# Patient Record
Sex: Male | Born: 1935 | Race: White | Hispanic: No | Marital: Married | Smoking: Former smoker
Health system: Southern US, Community
[De-identification: ages and names within clinical notes are randomized; demographics above are authoritative.]

## PROBLEM LIST (undated history)

## (undated) DIAGNOSIS — M1990 Unspecified osteoarthritis, unspecified site: Secondary | ICD-10-CM

## (undated) DIAGNOSIS — E119 Type 2 diabetes mellitus without complications: Secondary | ICD-10-CM

## (undated) DIAGNOSIS — M792 Neuralgia and neuritis, unspecified: Secondary | ICD-10-CM

## (undated) DIAGNOSIS — K219 Gastro-esophageal reflux disease without esophagitis: Secondary | ICD-10-CM

## (undated) DIAGNOSIS — I10 Essential (primary) hypertension: Secondary | ICD-10-CM

## (undated) HISTORY — PX: BACK SURGERY: SHX140

## (undated) HISTORY — PX: JOINT REPLACEMENT: SHX530

---

## 2011-08-30 DIAGNOSIS — J310 Chronic rhinitis: Secondary | ICD-10-CM | POA: Insufficient documentation

## 2011-08-30 DIAGNOSIS — J342 Deviated nasal septum: Secondary | ICD-10-CM | POA: Insufficient documentation

## 2014-01-16 DIAGNOSIS — M25511 Pain in right shoulder: Secondary | ICD-10-CM | POA: Insufficient documentation

## 2014-01-16 DIAGNOSIS — C4491 Basal cell carcinoma of skin, unspecified: Secondary | ICD-10-CM | POA: Insufficient documentation

## 2014-01-16 DIAGNOSIS — M205X9 Other deformities of toe(s) (acquired), unspecified foot: Secondary | ICD-10-CM | POA: Insufficient documentation

## 2014-01-16 DIAGNOSIS — N529 Male erectile dysfunction, unspecified: Secondary | ICD-10-CM | POA: Insufficient documentation

## 2014-01-16 DIAGNOSIS — K219 Gastro-esophageal reflux disease without esophagitis: Secondary | ICD-10-CM | POA: Insufficient documentation

## 2014-01-16 DIAGNOSIS — R531 Weakness: Secondary | ICD-10-CM | POA: Insufficient documentation

## 2014-01-16 DIAGNOSIS — M109 Gout, unspecified: Secondary | ICD-10-CM | POA: Insufficient documentation

## 2014-01-16 DIAGNOSIS — N181 Chronic kidney disease, stage 1: Secondary | ICD-10-CM | POA: Insufficient documentation

## 2014-01-16 DIAGNOSIS — E1129 Type 2 diabetes mellitus with other diabetic kidney complication: Secondary | ICD-10-CM | POA: Diagnosis present

## 2014-01-16 DIAGNOSIS — M1990 Unspecified osteoarthritis, unspecified site: Secondary | ICD-10-CM | POA: Insufficient documentation

## 2014-01-16 DIAGNOSIS — N400 Enlarged prostate without lower urinary tract symptoms: Secondary | ICD-10-CM | POA: Insufficient documentation

## 2014-01-16 DIAGNOSIS — E1142 Type 2 diabetes mellitus with diabetic polyneuropathy: Secondary | ICD-10-CM | POA: Diagnosis present

## 2014-01-16 DIAGNOSIS — M7541 Impingement syndrome of right shoulder: Secondary | ICD-10-CM | POA: Insufficient documentation

## 2014-01-16 DIAGNOSIS — H409 Unspecified glaucoma: Secondary | ICD-10-CM | POA: Insufficient documentation

## 2014-01-16 DIAGNOSIS — N1832 Chronic kidney disease, stage 3b: Secondary | ICD-10-CM | POA: Diagnosis present

## 2014-01-16 DIAGNOSIS — M7551 Bursitis of right shoulder: Secondary | ICD-10-CM | POA: Insufficient documentation

## 2015-04-01 DIAGNOSIS — M2041 Other hammer toe(s) (acquired), right foot: Secondary | ICD-10-CM | POA: Insufficient documentation

## 2015-04-01 DIAGNOSIS — E782 Mixed hyperlipidemia: Secondary | ICD-10-CM | POA: Diagnosis present

## 2015-04-01 DIAGNOSIS — Z86010 Personal history of colonic polyps: Secondary | ICD-10-CM | POA: Insufficient documentation

## 2015-04-01 DIAGNOSIS — G629 Polyneuropathy, unspecified: Secondary | ICD-10-CM | POA: Insufficient documentation

## 2015-04-01 DIAGNOSIS — M170 Bilateral primary osteoarthritis of knee: Secondary | ICD-10-CM | POA: Insufficient documentation

## 2015-04-01 DIAGNOSIS — M2042 Other hammer toe(s) (acquired), left foot: Secondary | ICD-10-CM | POA: Insufficient documentation

## 2017-02-16 DIAGNOSIS — M4310 Spondylolisthesis, site unspecified: Secondary | ICD-10-CM | POA: Insufficient documentation

## 2017-04-12 DIAGNOSIS — M5136 Other intervertebral disc degeneration, lumbar region: Secondary | ICD-10-CM | POA: Insufficient documentation

## 2017-05-16 DIAGNOSIS — G894 Chronic pain syndrome: Secondary | ICD-10-CM | POA: Insufficient documentation

## 2017-07-04 DIAGNOSIS — M1611 Unilateral primary osteoarthritis, right hip: Secondary | ICD-10-CM | POA: Insufficient documentation

## 2017-08-01 DIAGNOSIS — Z96641 Presence of right artificial hip joint: Secondary | ICD-10-CM | POA: Insufficient documentation

## 2018-05-30 DIAGNOSIS — Z981 Arthrodesis status: Secondary | ICD-10-CM | POA: Insufficient documentation

## 2018-08-09 DIAGNOSIS — Z9889 Other specified postprocedural states: Secondary | ICD-10-CM | POA: Insufficient documentation

## 2019-01-06 DIAGNOSIS — Z9889 Other specified postprocedural states: Secondary | ICD-10-CM | POA: Insufficient documentation

## 2020-03-02 DIAGNOSIS — Z87898 Personal history of other specified conditions: Secondary | ICD-10-CM | POA: Insufficient documentation

## 2020-03-02 DIAGNOSIS — G959 Disease of spinal cord, unspecified: Secondary | ICD-10-CM | POA: Insufficient documentation

## 2020-03-31 DIAGNOSIS — M7138 Other bursal cyst, other site: Secondary | ICD-10-CM | POA: Insufficient documentation

## 2020-07-06 DIAGNOSIS — D500 Iron deficiency anemia secondary to blood loss (chronic): Secondary | ICD-10-CM | POA: Insufficient documentation

## 2020-07-06 DIAGNOSIS — R55 Syncope and collapse: Secondary | ICD-10-CM | POA: Insufficient documentation

## 2020-07-06 DIAGNOSIS — E876 Hypokalemia: Secondary | ICD-10-CM | POA: Insufficient documentation

## 2020-07-06 DIAGNOSIS — R778 Other specified abnormalities of plasma proteins: Secondary | ICD-10-CM | POA: Insufficient documentation

## 2020-07-06 DIAGNOSIS — R319 Hematuria, unspecified: Secondary | ICD-10-CM | POA: Insufficient documentation

## 2020-07-06 DIAGNOSIS — R7989 Other specified abnormal findings of blood chemistry: Secondary | ICD-10-CM | POA: Insufficient documentation

## 2020-11-12 DIAGNOSIS — L821 Other seborrheic keratosis: Secondary | ICD-10-CM | POA: Insufficient documentation

## 2021-02-02 DIAGNOSIS — R29898 Other symptoms and signs involving the musculoskeletal system: Secondary | ICD-10-CM | POA: Insufficient documentation

## 2021-05-27 ENCOUNTER — Emergency Department (HOSPITAL_BASED_OUTPATIENT_CLINIC_OR_DEPARTMENT_OTHER): Payer: Medicare Other

## 2021-05-27 ENCOUNTER — Encounter (HOSPITAL_BASED_OUTPATIENT_CLINIC_OR_DEPARTMENT_OTHER): Payer: Self-pay | Admitting: *Deleted

## 2021-05-27 ENCOUNTER — Inpatient Hospital Stay (HOSPITAL_BASED_OUTPATIENT_CLINIC_OR_DEPARTMENT_OTHER)
Admission: EM | Admit: 2021-05-27 | Discharge: 2021-05-30 | DRG: 561 | Disposition: A | Discharge status: Skilled Nursing Facility | Payer: Medicare Other | Attending: Internal Medicine | Admitting: Internal Medicine

## 2021-05-27 ENCOUNTER — Other Ambulatory Visit: Payer: Self-pay

## 2021-05-27 DIAGNOSIS — H905 Unspecified sensorineural hearing loss: Secondary | ICD-10-CM | POA: Insufficient documentation

## 2021-05-27 DIAGNOSIS — Z87891 Personal history of nicotine dependence: Secondary | ICD-10-CM

## 2021-05-27 DIAGNOSIS — Z7984 Long term (current) use of oral hypoglycemic drugs: Secondary | ICD-10-CM

## 2021-05-27 DIAGNOSIS — S0181XA Laceration without foreign body of other part of head, initial encounter: Secondary | ICD-10-CM | POA: Diagnosis present

## 2021-05-27 DIAGNOSIS — Z66 Do not resuscitate: Secondary | ICD-10-CM | POA: Diagnosis present

## 2021-05-27 DIAGNOSIS — I129 Hypertensive chronic kidney disease with stage 1 through stage 4 chronic kidney disease, or unspecified chronic kidney disease: Secondary | ICD-10-CM | POA: Diagnosis present

## 2021-05-27 DIAGNOSIS — E1142 Type 2 diabetes mellitus with diabetic polyneuropathy: Secondary | ICD-10-CM | POA: Diagnosis present

## 2021-05-27 DIAGNOSIS — H9319 Tinnitus, unspecified ear: Secondary | ICD-10-CM | POA: Insufficient documentation

## 2021-05-27 DIAGNOSIS — Z79899 Other long term (current) drug therapy: Secondary | ICD-10-CM

## 2021-05-27 DIAGNOSIS — Z96649 Presence of unspecified artificial hip joint: Secondary | ICD-10-CM

## 2021-05-27 DIAGNOSIS — I10 Essential (primary) hypertension: Secondary | ICD-10-CM | POA: Diagnosis present

## 2021-05-27 DIAGNOSIS — Z888 Allergy status to other drugs, medicaments and biological substances status: Secondary | ICD-10-CM

## 2021-05-27 DIAGNOSIS — K219 Gastro-esophageal reflux disease without esophagitis: Secondary | ICD-10-CM | POA: Diagnosis present

## 2021-05-27 DIAGNOSIS — Z0001 Encounter for general adult medical examination with abnormal findings: Secondary | ICD-10-CM | POA: Insufficient documentation

## 2021-05-27 DIAGNOSIS — Z20822 Contact with and (suspected) exposure to covid-19: Secondary | ICD-10-CM | POA: Diagnosis present

## 2021-05-27 DIAGNOSIS — E669 Obesity, unspecified: Secondary | ICD-10-CM | POA: Insufficient documentation

## 2021-05-27 DIAGNOSIS — E782 Mixed hyperlipidemia: Secondary | ICD-10-CM | POA: Diagnosis present

## 2021-05-27 DIAGNOSIS — M9701XA Periprosthetic fracture around internal prosthetic right hip joint, initial encounter: Principal | ICD-10-CM | POA: Diagnosis present

## 2021-05-27 DIAGNOSIS — R001 Bradycardia, unspecified: Secondary | ICD-10-CM | POA: Diagnosis not present

## 2021-05-27 DIAGNOSIS — M978XXA Periprosthetic fracture around other internal prosthetic joint, initial encounter: Secondary | ICD-10-CM

## 2021-05-27 DIAGNOSIS — Z7982 Long term (current) use of aspirin: Secondary | ICD-10-CM

## 2021-05-27 DIAGNOSIS — E1129 Type 2 diabetes mellitus with other diabetic kidney complication: Secondary | ICD-10-CM | POA: Diagnosis present

## 2021-05-27 DIAGNOSIS — M25551 Pain in right hip: Secondary | ICD-10-CM

## 2021-05-27 DIAGNOSIS — W01190A Fall on same level from slipping, tripping and stumbling with subsequent striking against furniture, initial encounter: Secondary | ICD-10-CM | POA: Diagnosis present

## 2021-05-27 DIAGNOSIS — M109 Gout, unspecified: Secondary | ICD-10-CM | POA: Insufficient documentation

## 2021-05-27 DIAGNOSIS — Z461 Encounter for fitting and adjustment of hearing aid: Secondary | ICD-10-CM | POA: Insufficient documentation

## 2021-05-27 DIAGNOSIS — E1122 Type 2 diabetes mellitus with diabetic chronic kidney disease: Secondary | ICD-10-CM | POA: Diagnosis present

## 2021-05-27 DIAGNOSIS — N1832 Chronic kidney disease, stage 3b: Secondary | ICD-10-CM | POA: Diagnosis present

## 2021-05-27 DIAGNOSIS — Z7409 Other reduced mobility: Secondary | ICD-10-CM | POA: Insufficient documentation

## 2021-05-27 DIAGNOSIS — R269 Unspecified abnormalities of gait and mobility: Secondary | ICD-10-CM | POA: Diagnosis present

## 2021-05-27 DIAGNOSIS — M25569 Pain in unspecified knee: Secondary | ICD-10-CM | POA: Insufficient documentation

## 2021-05-27 DIAGNOSIS — R262 Difficulty in walking, not elsewhere classified: Secondary | ICD-10-CM | POA: Diagnosis present

## 2021-05-27 DIAGNOSIS — Y92511 Restaurant or cafe as the place of occurrence of the external cause: Secondary | ICD-10-CM

## 2021-05-27 HISTORY — DX: Gastro-esophageal reflux disease without esophagitis: K21.9

## 2021-05-27 HISTORY — DX: Essential (primary) hypertension: I10

## 2021-05-27 HISTORY — DX: Type 2 diabetes mellitus without complications: E11.9

## 2021-05-27 HISTORY — DX: Unspecified osteoarthritis, unspecified site: M19.90

## 2021-05-27 HISTORY — DX: Neuralgia and neuritis, unspecified: M79.2

## 2021-05-27 LAB — RESP PANEL BY RT-PCR (FLU A&B, COVID) ARPGX2
Influenza A by PCR: NEGATIVE
Influenza B by PCR: NEGATIVE
SARS Coronavirus 2 by RT PCR: NEGATIVE

## 2021-05-27 LAB — BASIC METABOLIC PANEL
Anion gap: 12 (ref 5–15)
BUN: 36 mg/dL — ABNORMAL HIGH (ref 8–23)
CO2: 23 mmol/L (ref 22–32)
Calcium: 9.1 mg/dL (ref 8.9–10.3)
Chloride: 101 mmol/L (ref 98–111)
Creatinine, Ser: 1.82 mg/dL — ABNORMAL HIGH (ref 0.61–1.24)
GFR, Estimated: 35 mL/min — ABNORMAL LOW (ref >60)
Glucose, Bld: 134 mg/dL — ABNORMAL HIGH (ref 70–99)
Potassium: 3.9 mmol/L (ref 3.5–5.1)
Sodium: 136 mmol/L (ref 135–145)

## 2021-05-27 LAB — CBC WITH DIFFERENTIAL/PLATELET
Abs Immature Granulocytes: 0.06 10*3/uL (ref 0.00–0.07)
Basophils Absolute: 0.1 10*3/uL (ref 0.0–0.1)
Basophils Relative: 1 %
Eosinophils Absolute: 0.2 10*3/uL (ref 0.0–0.5)
Eosinophils Relative: 2 %
HCT: 32.8 % — ABNORMAL LOW (ref 39.0–52.0)
Hemoglobin: 11.5 g/dL — ABNORMAL LOW (ref 13.0–17.0)
Immature Granulocytes: 1 %
Lymphocytes Relative: 12 %
Lymphs Abs: 1.2 10*3/uL (ref 0.7–4.0)
MCH: 31.3 pg (ref 26.0–34.0)
MCHC: 35.1 g/dL (ref 30.0–36.0)
MCV: 89.1 fL (ref 80.0–100.0)
Monocytes Absolute: 0.5 10*3/uL (ref 0.1–1.0)
Monocytes Relative: 5 %
Neutro Abs: 7.8 10*3/uL — ABNORMAL HIGH (ref 1.7–7.7)
Neutrophils Relative %: 79 %
Platelets: 169 10*3/uL (ref 150–400)
RBC: 3.68 MIL/uL — ABNORMAL LOW (ref 4.22–5.81)
RDW: 13.2 % (ref 11.5–15.5)
WBC: 9.9 10*3/uL (ref 4.0–10.5)
nRBC: 0 % (ref 0.0–0.2)

## 2021-05-27 LAB — CBG MONITORING, ED
Glucose-Capillary: 157 mg/dL — ABNORMAL HIGH (ref 70–99)
Glucose-Capillary: 139 mg/dL — ABNORMAL HIGH (ref 70–99)

## 2021-05-27 MED ORDER — SODIUM CHLORIDE 0.9 % IV BOLUS
1000.0000 mL | Freq: Once | INTRAVENOUS | Status: AC
  Administered 2021-05-27: 1000 mL via INTRAVENOUS

## 2021-05-27 MED ORDER — OXYCODONE HCL 5 MG PO TABS
5.0000 mg | ORAL_TABLET | Freq: Once | ORAL | Status: AC
  Administered 2021-05-27: 5 mg via ORAL
  Filled 2021-05-27: qty 1

## 2021-05-27 MED ORDER — ACETAMINOPHEN 500 MG PO TABS
1000.0000 mg | ORAL_TABLET | Freq: Once | ORAL | Status: AC
  Administered 2021-05-27: 1000 mg via ORAL
  Filled 2021-05-27: qty 2

## 2021-05-27 MED ORDER — LIDOCAINE-EPINEPHRINE (PF) 2 %-1:200000 IJ SOLN
10.0000 mL | Freq: Once | INTRAMUSCULAR | Status: AC
  Administered 2021-05-27: 10 mL via INTRADERMAL
  Filled 2021-05-27: qty 20

## 2021-05-27 NOTE — ED Provider Notes (Signed)
[facility] EMERGENCY DEPARTMENT Provider Note   CSN: [id number] Arrival date & time: 05/27/21  1306     History  Chief Complaint  Patient presents with   Laceration    Joe Byrd is a 88 y.o. male.  88 yo M with a chief complaint of a fall.  The patient went to a [place] after swimming this morning and he lost his balance carrying his food and tripped over his cane.  He fell onto his forehead.  Also complaining of pain to the right hip.  Felt like he is not able to bear weight with that right leg anymore.  Has a history of a right hip replacement on that side.   Laceration     Home Medications Prior to Admission medications   Medication Sig Start Date End Date Taking? Authorizing Provider  aspirin 81 MG chewable tablet Chew 81 mg by mouth daily.   Yes [provider]  gabapentin (NEURONTIN) 250 MG/5ML solution Take by mouth 3 (three) times daily.   Yes [provider]  metFORMIN (GLUCOPHAGE) 500 MG tablet Take by mouth 2 (two) times daily with a meal.   Yes [provider]      Allergies    Lipitor [atorvastatin]    Review of Systems   Review of Systems  Physical Exam Updated Vital Signs BP (!) 126/54 (BP Location: Right Arm)    Pulse (!) 40    Temp 98.2 F (36.8 C) (Oral)    Resp 14    Ht 5\' 10"  (1.778 m)    Wt 87.5 kg    SpO2 95%    BMI 27.69 kg/m  Physical Exam Vitals and nursing note reviewed.  Constitutional:      Appearance: He is well-developed.  HENT:     Head: Normocephalic.     Comments: Stellate laceration to the right forehead just above the brow.  No obvious midline C-spine tenderness.  Able to rotate his head without pain. Eyes:     Pupils: Pupils are equal, round, and reactive to light.  Neck:     Vascular: No JVD.  Cardiovascular:     Rate and Rhythm: Normal rate and regular rhythm.     Heart sounds: No murmur heard.   No friction rub. No gallop.  Pulmonary:     Effort: No respiratory  distress.     Breath sounds: No wheezing.  Abdominal:     General: There is no distension.     Tenderness: There is no abdominal tenderness. There is no guarding or rebound.  Musculoskeletal:        General: Tenderness present. Normal range of motion.     Cervical back: Normal range of motion and neck supple.     Comments: Right lower extremity is slightly longer than the left.  Some mild pain with internal and external rotation of the leg.  Pain with compression of the pelvis.  No obvious pain with deep palpation in the right lower quadrant.  No pelvic instability.  Skin:    Coloration: Skin is not pale.     Findings: No rash.  Neurological:     Mental Status: He is alert and oriented to person, place, and time.  Psychiatric:        Behavior: Behavior normal.    ED Results / Procedures / Treatments   Labs (all labs ordered are listed, but only abnormal results are displayed) Labs Reviewed  CBC WITH DIFFERENTIAL/PLATELET - Abnormal; Notable for the following components:  Result Value   RBC 3.68 (*)    Hemoglobin 11.5 (*)    HCT 32.8 (*)    Neutro Abs 7.8 (*)    All other components within normal limits  BASIC METABOLIC PANEL - Abnormal; Notable for the following components:   Glucose, Bld 134 (*)    BUN 36 (*)    Creatinine, Ser 1.82 (*)    GFR, Estimated 35 (*)    All other components within normal limits    EKG None  Radiology CT Head Wo Contrast  Result Date: 05/27/2021 CLINICAL DATA:  Head trauma, minor (Age >= 65y). Left forehead laceration EXAM: CT HEAD WITHOUT CONTRAST TECHNIQUE: Contiguous axial images were obtained from the base of the skull through the vertex without intravenous contrast. RADIATION DOSE REDUCTION: This exam was performed according to the departmental dose-optimization program which includes automated exposure control, adjustment of the mA and/or kV according to patient size and/or use of iterative reconstruction technique. COMPARISON:   07/06/2020 FINDINGS: Brain: No evidence of acute infarction, hemorrhage, hydrocephalus, extra-axial collection or mass lesion/mass effect. Scattered low-density changes within the periventricular and subcortical white matter compatible with chronic microvascular ischemic change. Mild diffuse cerebral volume loss. Vascular: Atherosclerotic calcifications involving the large vessels of the skull base. No unexpected hyperdense vessel. Skull: Normal. Negative for fracture or focal lesion. Sinuses/Orbits: Partial right mastoid effusion. The visualized paranasal sinuses are clear. Other: Mild soft tissue swelling in the right supraorbital region. No scalp hematoma. No radiopaque foreign body. IMPRESSION: 1. No acute intracranial abnormality. 2. Mild soft tissue swelling in the right supraorbital region. No scalp hematoma. No radiopaque foreign body. 3. Partial right mastoid effusion. Electronically Signed   By: Davina Poke D.O.   On: 05/27/2021 14:13   DG Chest Port 1 View  Result Date: 05/27/2021 CLINICAL DATA:  Hip pain post fall EXAM: PORTABLE CHEST 1 VIEW COMPARISON:  07/06/2020 FINDINGS: Enlargement of cardiac silhouette. Prominent RIGHT paratracheal soft tissues unchanged question related to vascular prominence. Pulmonary vascularity normal. Atherosclerotic calcification aorta aortic arch. Minimal bibasilar atelectasis. Lungs otherwise clear. No acute infiltrate, pleural effusion, or pneumothorax. IMPRESSION: Minimal bibasilar atelectasis. Mild enlargement of cardiac silhouette. Electronically Signed   By: Lavonia Dana M.D.   On: 05/27/2021 14:33   DG Hip Unilat W or Wo Pelvis 2-3 Views Right  Result Date: 05/27/2021 CLINICAL DATA:  Hip pain post fall EXAM: DG HIP (WITH OR WITHOUT PELVIS) 2-3V RIGHT COMPARISON:  Pelvic radiograph 07/11/2017 FINDINGS: Interval RIGHT total hip arthroplasty. Osseous mineralization diminished. Slight irregularity at the lateral margin of the junction of the greater  trochanter and femoral metaphysis similar to prior CT exam with overlying small rounded ossicle. No definite acute fracture, dislocation, or bone destruction. IMPRESSION: RIGHT hip prosthesis. No definite acute fracture or dislocation. Electronically Signed   By: Lavonia Dana M.D.   On: 05/27/2021 14:38    Procedures .Marland KitchenLaceration Repair  Date/Time: 05/27/2021 3:09 PM Performed by: Joe Etienne, DO Authorized by: Joe Etienne, DO   Consent:    Consent obtained:  Verbal   Consent given by:  Patient   Risks, benefits, and alternatives were discussed: yes     Risks discussed:  Infection, pain, poor cosmetic result and poor wound healing   Alternatives discussed:  No treatment, delayed treatment and observation Universal protocol:    Procedure explained and questions answered to patient or proxy's satisfaction: yes     Imaging studies available: yes     Immediately prior to procedure, a time out was called:  yes     Patient identity confirmed:  Verbally with patient Anesthesia:    Anesthesia method:  Local infiltration   Local anesthetic:  Lidocaine 2% WITH epi Laceration details:    Location:  Face   Face location:  Forehead   Length (cm):  2.8 Pre-procedure details:    Preparation:  Patient was prepped and draped in usual sterile fashion Exploration:    Limited defect created (wound extended): no     Wound exploration: entire depth of wound visualized     Wound extent: no vascular damage noted   Treatment:    Area cleansed with:  Chlorhexidine   Amount of cleaning:  Standard   Irrigation solution:  Sterile saline    Medications Ordered in ED Medications  sodium chloride 0.9 % bolus 1,000 mL (has no administration in time range)  lidocaine-EPINEPHrine (XYLOCAINE W/EPI) 2 %-1:200000 (PF) injection 10 mL (10 mLs Intradermal Given 05/27/21 1447)  acetaminophen (TYLENOL) tablet 1,000 mg (1,000 mg Oral Given 05/27/21 1448)  oxyCODONE (Oxy IR/ROXICODONE) immediate release tablet 5 mg (5 mg  Oral Given 05/27/21 1448)    ED Course/ Medical Decision Making/ A&P                           Medical Decision Making Amount and/or Complexity of Data Reviewed Labs: ordered. Radiology: ordered. ECG/medicine tests: ordered.  Risk OTC drugs. Prescription drug management.   Patient is a 88 y.o. male with a cc of a fall.  Nonsyncopal by history.  Complaining of pain mostly to the head and also pain to the right hip.  He tells me that he is unable to move the right leg up off the bed against gravity.  Concern for a periprosthetic fracture versus dislocation versus pelvic fracture will obtain a plain film.  As he is on aspirin and struck his head will obtain a head CT.  He has no C-spine complaints and is able to range his head without issue.  Will obtain blood work EKG.    Plain film of the hip without fx of dislocation.  No noted pelvic fx.  CT head negative for ICH.  Wound repaired at bedside.  Repair wound.     Patient unable to ambulate on trial.  Got a bit lightheaded and bradycardic.  We will give a bolus of IV fluids.  CT of the pelvis.  CT of the low back.  Signed out to [facility], please see their note for further details of care in the ED.  The patients results and plan were reviewed and discussed.   Any x-rays performed were independently reviewed by myself.   Differential diagnosis were considered with the presenting HPI.  Medications  sodium chloride 0.9 % bolus 1,000 mL (has no administration in time range)  lidocaine-EPINEPHrine (XYLOCAINE W/EPI) 2 %-1:200000 (PF) injection 10 mL (10 mLs Intradermal Given 05/27/21 1447)  acetaminophen (TYLENOL) tablet 1,000 mg (1,000 mg Oral Given 05/27/21 1448)  oxyCODONE (Oxy IR/ROXICODONE) immediate release tablet 5 mg (5 mg Oral Given 05/27/21 1448)    Vitals:   05/27/21 1311 05/27/21 1325 05/27/21 1330 05/27/21 1524  BP:  (!) 179/70 (!) 181/115 (!) 126/54  Pulse:  (!) 58 (!) 58 (!) 40  Resp:  18 16 14   Temp:  98.2 F (36.8 C)     TempSrc:  Oral    SpO2:  95% 96% 95%  Weight: 87.5 kg     Height: 5\' 10"  (1.778 m)  Final diagnoses:  Facial laceration, initial encounter  Right hip pain            Final Clinical Impression(s) / ED Diagnoses Final diagnoses:  Facial laceration, initial encounter  Right hip pain    Rx / DC Orders ED Discharge Orders     None         Joe Etienne, DO 05/27/21 1530

## 2021-05-27 NOTE — ED Notes (Signed)
Report called to Myles Lipps RN.

## 2021-05-27 NOTE — ED Notes (Signed)
Called [facility] for ortho consult.  Spoke to [employer]

## 2021-05-27 NOTE — ED Triage Notes (Signed)
Tripped in restaurant hit rt forehead on table  approx 1 inch lac  bleeding controlled,   also c/o rt leg pain and numbness   states may have fell over cane or leg gave away

## 2021-05-27 NOTE — ED Notes (Signed)
States was out eating and had mechanical fall, trip with his cane, denies chest pain, SOB or dizziness. Laceration above Rt eye also c/o pain at Rt hip, at old hip surgical incision site. States after fall had difficulty using RLE. Has strong grips, and dorsal / plantar flexion bilaterally. BEFAST and VAN negative.

## 2021-05-27 NOTE — ED Provider Notes (Signed)
Sign out note  88 y/o male with fall, facial laceration. Has R hip pain. XR negative, CT head neg. Repair of head lac done. Plan at time of sign out to ambulate. If unable to ambulate, likely check CT to r/o occult hip/pelvis injury.  While awaiting CT scan, patient had episode of bradycardia, relative though not frank hypotension and patient reported feeling lightheaded.  Provided bolus of fluids and p.o.  His symptoms significantly improved, heart rate now in 50s.  Reviewed basic laboratory work, demonstrates elevation in creatinine.  Patient is unsure of his baseline creatinine but does report history of CKD stage I.    CT scan demonstrates periprosthetic hip fracture.  Patient reports his original surgery was with Dr. [name] with [facility].  Will touch base with our on-call orthopedist, Dr. Lynann Bologna.  He reports that he will review the images but request that I discussed with his orthopedist.  Discussed with Dr. Cecilie Lowers on-call for Dr. Laurance Flatten.  He states based on the location of this periprosthetic fracture he would not anticipate any intervention.  Inquired if he prefers patient admitted to [facility] facility or okay to admit to [facility] and he states it is fine to have patient admitted and managed in the [facility] health system.  Given patient's pain, likely AKI, episode of bradycardia, feel he would benefit from admission for pain control, PT OT and further observation.  Will reach back out to Dr. Lynann Bologna and admit to the hospitalist service.   Lucrezia Starch, MD 05/27/21 [phone]

## 2021-05-27 NOTE — ED Notes (Signed)
To room to ambulate client, pt states he felt weak and tired, sat on side of bed, stated he felt faint, immediately placed client in supine position, HR decreased to 38/min, skin remains warm and dry. States he feels light headed.

## 2021-05-28 ENCOUNTER — Encounter (HOSPITAL_COMMUNITY): Payer: Self-pay | Admitting: Family Medicine

## 2021-05-28 DIAGNOSIS — Z7984 Long term (current) use of oral hypoglycemic drugs: Secondary | ICD-10-CM | POA: Diagnosis not present

## 2021-05-28 DIAGNOSIS — N1832 Chronic kidney disease, stage 3b: Secondary | ICD-10-CM | POA: Diagnosis not present

## 2021-05-28 DIAGNOSIS — Z87891 Personal history of nicotine dependence: Secondary | ICD-10-CM | POA: Diagnosis not present

## 2021-05-28 DIAGNOSIS — E1142 Type 2 diabetes mellitus with diabetic polyneuropathy: Secondary | ICD-10-CM | POA: Diagnosis not present

## 2021-05-28 DIAGNOSIS — K219 Gastro-esophageal reflux disease without esophagitis: Secondary | ICD-10-CM | POA: Diagnosis not present

## 2021-05-28 DIAGNOSIS — M9701XA Periprosthetic fracture around internal prosthetic right hip joint, initial encounter: Secondary | ICD-10-CM | POA: Diagnosis not present

## 2021-05-28 DIAGNOSIS — I129 Hypertensive chronic kidney disease with stage 1 through stage 4 chronic kidney disease, or unspecified chronic kidney disease: Secondary | ICD-10-CM | POA: Diagnosis not present

## 2021-05-28 DIAGNOSIS — Z96649 Presence of unspecified artificial hip joint: Secondary | ICD-10-CM

## 2021-05-28 DIAGNOSIS — Y92511 Restaurant or cafe as the place of occurrence of the external cause: Secondary | ICD-10-CM | POA: Diagnosis not present

## 2021-05-28 DIAGNOSIS — E782 Mixed hyperlipidemia: Secondary | ICD-10-CM | POA: Diagnosis not present

## 2021-05-28 DIAGNOSIS — Z66 Do not resuscitate: Secondary | ICD-10-CM | POA: Diagnosis present

## 2021-05-28 DIAGNOSIS — W01190A Fall on same level from slipping, tripping and stumbling with subsequent striking against furniture, initial encounter: Secondary | ICD-10-CM | POA: Diagnosis not present

## 2021-05-28 DIAGNOSIS — Z79899 Other long term (current) drug therapy: Secondary | ICD-10-CM | POA: Diagnosis not present

## 2021-05-28 DIAGNOSIS — M978XXA Periprosthetic fracture around other internal prosthetic joint, initial encounter: Secondary | ICD-10-CM

## 2021-05-28 DIAGNOSIS — Z7982 Long term (current) use of aspirin: Secondary | ICD-10-CM | POA: Diagnosis not present

## 2021-05-28 DIAGNOSIS — E1122 Type 2 diabetes mellitus with diabetic chronic kidney disease: Secondary | ICD-10-CM | POA: Diagnosis not present

## 2021-05-28 DIAGNOSIS — Z20822 Contact with and (suspected) exposure to covid-19: Secondary | ICD-10-CM | POA: Diagnosis not present

## 2021-05-28 DIAGNOSIS — S0181XA Laceration without foreign body of other part of head, initial encounter: Secondary | ICD-10-CM | POA: Diagnosis not present

## 2021-05-28 DIAGNOSIS — Z888 Allergy status to other drugs, medicaments and biological substances status: Secondary | ICD-10-CM | POA: Diagnosis not present

## 2021-05-28 DIAGNOSIS — R001 Bradycardia, unspecified: Secondary | ICD-10-CM | POA: Diagnosis present

## 2021-05-28 DIAGNOSIS — R269 Unspecified abnormalities of gait and mobility: Secondary | ICD-10-CM | POA: Diagnosis not present

## 2021-05-28 DIAGNOSIS — I10 Essential (primary) hypertension: Secondary | ICD-10-CM | POA: Diagnosis present

## 2021-05-28 LAB — GLUCOSE, CAPILLARY
Glucose-Capillary: 147 mg/dL — ABNORMAL HIGH (ref 70–99)
Glucose-Capillary: 155 mg/dL — ABNORMAL HIGH (ref 70–99)
Glucose-Capillary: 194 mg/dL — ABNORMAL HIGH (ref 70–99)
Glucose-Capillary: 175 mg/dL — ABNORMAL HIGH (ref 70–99)

## 2021-05-28 MED ORDER — OXYCODONE HCL 5 MG PO TABS
5.0000 mg | ORAL_TABLET | ORAL | Status: DC | PRN
  Administered 2021-05-28 – 2021-05-29 (×4): 5 mg via ORAL
  Filled 2021-05-28 (×5): qty 1

## 2021-05-28 MED ORDER — ENOXAPARIN SODIUM 30 MG/0.3ML IJ SOSY
30.0000 mg | PREFILLED_SYRINGE | INTRAMUSCULAR | Status: DC
  Administered 2021-05-28 – 2021-05-30 (×3): 30 mg via SUBCUTANEOUS
  Filled 2021-05-28 (×3): qty 0.3

## 2021-05-28 MED ORDER — GABAPENTIN 300 MG PO CAPS
300.0000 mg | ORAL_CAPSULE | Freq: Three times a day (TID) | ORAL | Status: DC
  Administered 2021-05-28 – 2021-05-30 (×7): 300 mg via ORAL
  Filled 2021-05-28 (×7): qty 1

## 2021-05-28 MED ORDER — LACTATED RINGERS IV SOLN: INTRAVENOUS | Status: AC

## 2021-05-28 MED ORDER — ONDANSETRON HCL 4 MG/2ML IJ SOLN: 4.0000 mg | Freq: Once | INTRAMUSCULAR | Status: DC | PRN

## 2021-05-28 MED ORDER — HYDRALAZINE HCL 20 MG/ML IJ SOLN: 5.0000 mg | INTRAMUSCULAR | Status: DC | PRN

## 2021-05-28 MED ORDER — GABAPENTIN 300 MG PO CAPS
300.0000 mg | ORAL_CAPSULE | Freq: Three times a day (TID) | ORAL | Status: DC
Start: 2021-05-28 — End: 2021-05-28

## 2021-05-28 MED ORDER — ALLOPURINOL 300 MG PO TABS
150.0000 mg | ORAL_TABLET | Freq: Every day | ORAL | Status: DC
  Administered 2021-05-28 – 2021-05-30 (×3): 150 mg via ORAL
  Filled 2021-05-28 (×3): qty 1

## 2021-05-28 MED ORDER — PANTOPRAZOLE SODIUM 40 MG PO TBEC
40.0000 mg | DELAYED_RELEASE_TABLET | Freq: Every day | ORAL | Status: DC
  Administered 2021-05-28 – 2021-05-30 (×3): 40 mg via ORAL
  Filled 2021-05-28 (×3): qty 1

## 2021-05-28 MED ORDER — SODIUM CHLORIDE 0.9% FLUSH
3.0000 mL | Freq: Two times a day (BID) | INTRAVENOUS | Status: DC
  Administered 2021-05-29 – 2021-05-30 (×3): 3 mL via INTRAVENOUS

## 2021-05-28 MED ORDER — ASPIRIN 81 MG PO CHEW
81.0000 mg | CHEWABLE_TABLET | Freq: Every day | ORAL | Status: DC
Start: 2021-05-28 — End: 2021-05-28

## 2021-05-28 MED ORDER — ONDANSETRON HCL 4 MG PO TABS
4.0000 mg | ORAL_TABLET | Freq: Four times a day (QID) | ORAL | Status: DC | PRN
Start: 2021-05-28 — End: 2021-05-30

## 2021-05-28 MED ORDER — TERAZOSIN HCL 5 MG PO CAPS
5.0000 mg | ORAL_CAPSULE | Freq: Every day | ORAL | Status: DC
  Administered 2021-05-28 – 2021-05-29 (×2): 5 mg via ORAL
  Filled 2021-05-28 (×3): qty 1

## 2021-05-28 MED ORDER — FENTANYL CITRATE PF 50 MCG/ML IJ SOSY: 25.0000 ug | PREFILLED_SYRINGE | Freq: Once | INTRAMUSCULAR | Status: DC | PRN

## 2021-05-28 MED ORDER — OXYCODONE HCL 5 MG PO TABS: 5.0000 mg | ORAL_TABLET | Freq: Once | ORAL | Status: DC | PRN

## 2021-05-28 MED ORDER — ACETAMINOPHEN 325 MG PO TABS
650.0000 mg | ORAL_TABLET | Freq: Four times a day (QID) | ORAL | Status: DC | PRN
  Administered 2021-05-29 – 2021-05-30 (×2): 650 mg via ORAL
  Filled 2021-05-28 (×3): qty 2

## 2021-05-28 MED ORDER — DOCUSATE SODIUM 100 MG PO CAPS
100.0000 mg | ORAL_CAPSULE | Freq: Two times a day (BID) | ORAL | Status: DC
  Administered 2021-05-28 – 2021-05-29 (×4): 100 mg via ORAL
  Filled 2021-05-28 (×5): qty 1

## 2021-05-28 MED ORDER — AMLODIPINE BESYLATE 10 MG PO TABS
10.0000 mg | ORAL_TABLET | Freq: Every day | ORAL | Status: DC
Start: 2021-05-28 — End: 2021-05-30
  Administered 2021-05-28 – 2021-05-30 (×3): 10 mg via ORAL
  Filled 2021-05-28 (×3): qty 1

## 2021-05-28 MED ORDER — GEMFIBROZIL 600 MG PO TABS
600.0000 mg | ORAL_TABLET | Freq: Two times a day (BID) | ORAL | Status: DC
  Administered 2021-05-28 – 2021-05-30 (×5): 600 mg via ORAL
  Filled 2021-05-28 (×6): qty 1

## 2021-05-28 MED ORDER — POLYETHYLENE GLYCOL 3350 17 G PO PACK: 17.0000 g | PACK | Freq: Every day | ORAL | Status: DC | PRN

## 2021-05-28 MED ORDER — ONDANSETRON HCL 4 MG/2ML IJ SOLN: 4.0000 mg | Freq: Four times a day (QID) | INTRAMUSCULAR | Status: DC | PRN

## 2021-05-28 MED ORDER — INSULIN ASPART 100 UNIT/ML IJ SOLN
0.0000 [IU] | Freq: Three times a day (TID) | INTRAMUSCULAR | Status: DC
  Administered 2021-05-28 – 2021-05-29 (×3): 3 [IU] via SUBCUTANEOUS
  Administered 2021-05-29: 2 [IU] via SUBCUTANEOUS
  Administered 2021-05-29 – 2021-05-30 (×3): 3 [IU] via SUBCUTANEOUS

## 2021-05-28 MED ORDER — ASPIRIN EC 81 MG PO TBEC
81.0000 mg | DELAYED_RELEASE_TABLET | Freq: Every day | ORAL | Status: DC
  Administered 2021-05-28 – 2021-05-30 (×3): 81 mg via ORAL
  Filled 2021-05-28 (×3): qty 1

## 2021-05-28 MED ORDER — BISACODYL 5 MG PO TBEC: 5.0000 mg | DELAYED_RELEASE_TABLET | Freq: Every day | ORAL | Status: DC | PRN

## 2021-05-28 MED ORDER — MORPHINE SULFATE (PF) 2 MG/ML IV SOLN: 2.0000 mg | INTRAVENOUS | Status: DC | PRN

## 2021-05-28 MED ORDER — INSULIN ASPART 100 UNIT/ML IJ SOLN
0.0000 [IU] | Freq: Every day | INTRAMUSCULAR | Status: DC
  Administered 2021-05-29: 2 [IU] via SUBCUTANEOUS

## 2021-05-28 MED ORDER — ACETAMINOPHEN 650 MG RE SUPP: 650.0000 mg | Freq: Four times a day (QID) | RECTAL | Status: DC | PRN

## 2021-05-28 NOTE — ED Notes (Signed)
Spoke to Dr. Florina Ou regarding patient blood pressures which have been elevated consistently.  No new orders received due to episode of bradycardia yesterday.

## 2021-05-28 NOTE — H&P (Signed)
History and Physical    Patient: Joe Byrd OEU:[id number] DOB: [birth year] DOA: 05/27/2021 DOS: the patient was seen and examined on 05/28/2021 PCP: Burman Freestone, MD  Patient coming from: Home - lives with wife (88yo with dementia); NOK: [place], [place], [phone]   Chief Complaint: Fall  HPI: Joe Byrd is a 88 y.o. male with medical history significant of DM, stage 3b CKD, and HTN presenting with a fall.   He reports that he was going to eat lunch at [employer] and he was carrying his lunch back to the table and he collapsed. He hit the table with his forehead.  He is not certain if his leg gave way or what happened.  No LOC.  Not light-headed or dizzy.  He wanted to get up abut he couldn't bear weight on his R leg.  Hip replacement was about 8 years ago,  He has not been dizzy, light-headed, SOB - until he sat up at the ER and got light-headed x 1.  He doesn't know what his HR usually runs.    ER Course:  [facility] to [facility] transfer, per Dr. Myna Hidalgo:  Accepted from [facility] for symptomatic bradycardia. He is an 88 yr old with CKD IIIb, T2DM, and HTN on atenolol who presents with forehead lac and right hip pain after trip and fall. He had periprosthetic fracture involving right hip on CT which ED discussed with pt's ortho surgeon at [facility] who recommended non-operative mgmt. ED then discussed with ortho here (Dr. Lynann Bologna) will give recs regarding the hip. Pt then became lightheaded in ED with HR in 30s for several minutes, improving back to 60s without intervention.       Review of Systems: As mentioned in the history of present illness. All other systems reviewed and are negative. Past Medical History:  Diagnosis Date   Arthritis    Diabetes ([place])    GERD (gastroesophageal reflux disease)    HTN (hypertension)    Neuropathic pain    Past Surgical History:  Procedure Laterality Date   BACK SURGERY     JOINT REPLACEMENT     Social History:  reports that he quit smoking about 67 years  ago. His smoking use included cigarettes. He does not have any smokeless tobacco history on file. He reports that he does not drink alcohol and does not use drugs.  Allergies  Allergen Reactions   Atorvastatin Other (See Comments)    Elevated CK      History reviewed. No pertinent family history.  Prior to Admission medications   Medication Sig Start Date End Date Taking? Authorizing Provider  aspirin 81 MG chewable tablet Chew 81 mg by mouth daily.   Yes [provider]  gabapentin (NEURONTIN) 250 MG/5ML solution Take by mouth 3 (three) times daily.   Yes [provider]  metFORMIN (GLUCOPHAGE) 500 MG tablet Take by mouth 2 (two) times daily with a meal.   Yes [provider]  amLODipine (NORVASC) 10 MG tablet Take 10 mg by mouth daily. 04/25/21   [provider]  atenolol (TENORMIN) 50 MG tablet Take 50 mg by mouth daily. 03/23/21   [provider]  fluorouracil (EFUDEX) 5 % cream Apply topically 2 (two) times daily as needed. 03/03/21   [provider]  gabapentin (NEURONTIN) 300 MG capsule Take 300 mg by mouth 3 (three) times daily. 03/09/21   [provider]  gemfibrozil (LOPID) 600 MG tablet Take 600 mg by mouth 2 (two) times daily. 04/25/21  [provider]  glipiZIDE (GLUCOTROL) 10 MG tablet Take by mouth. 03/09/21   [provider]  hydrochlorothiazide (HYDRODIURIL) 25 MG tablet Take 25 mg by mouth daily. 04/25/21   [provider]  lisinopril-hydrochlorothiazide (ZESTORETIC) 20-25 MG tablet Take 1 tablet by mouth daily.    [provider]  losartan (COZAAR) 25 MG tablet Take 25 mg by mouth daily. 05/09/21   [provider]  metFORMIN (GLUCOPHAGE) 1000 MG tablet Take 1,000 mg by mouth 2 (two) times daily. 04/25/21   [provider]  omeprazole (PRILOSEC) 40 MG capsule Take 40 mg by mouth daily. 05/09/21   [provider]  promethazine-dextromethorphan  (PROMETHAZINE-DM) 6.25-15 MG/5ML syrup Take 5 mLs by mouth at bedtime as needed. 03/15/21   [provider]  terazosin (HYTRIN) 5 MG capsule Take 5 mg by mouth at bedtime. 04/25/21   [provider]    Physical Exam: Vitals:   05/28/21 0300 05/28/21 0415 05/28/21 0637 05/28/21 0734  BP: (!) 191/84 (!) 191/75 (!) 198/76 (!) 196/80  Pulse: 72 69 68 64  Resp: (!) 21 15 16 20   Temp:   98.2 F (36.8 C) 98.3 F (36.8 C)  TempSrc:   Oral Oral  SpO2: 98% 98% 97% 99%  Weight:   85.3 kg   Height:   5\' 10"  (1.778 m)    General:  Appears calm and comfortable and is in NAD, R forehead trauma s/p repair; very conversant Eyes:  EOMI, mild R periorbital ecchymosis ENT:  hard of hearing, grossly normal lips & tongue, mmm Neck:  no LAD, masses or thyromegaly Cardiovascular:  RRR, no m/r/g. No LE edema.  Respiratory:   CTA bilaterally with no wheezes/rales/rhonchi.  Normal respiratory effort. Abdomen:  soft, NT, ND Skin:  R forehead lac s/p repair Musculoskeletal:  R leg with limited ROM associated with pain, able to wiggle B toes Psychiatric:  grossly normal mood and affect, speech fluent and appropriate, AOx3 Neurologic:  CN 2-12 grossly intact, moves all extremities in coordinated fashion other than RLE due to pain   Radiological Exams on Admission: Independently reviewed - see discussion in A/P where applicable  CT Head Wo Contrast  Result Date: 05/27/2021 CLINICAL DATA:  Head trauma, minor (Age >= 65y). Left forehead laceration EXAM: CT HEAD WITHOUT CONTRAST TECHNIQUE: Contiguous axial images were obtained from the base of the skull through the vertex without intravenous contrast. RADIATION DOSE REDUCTION: This exam was performed according to the departmental dose-optimization program which includes automated exposure control, adjustment of the mA and/or kV according to patient size and/or use of iterative reconstruction technique. COMPARISON:  07/06/2020 FINDINGS: Brain: No  evidence of acute infarction, hemorrhage, hydrocephalus, extra-axial collection or mass lesion/mass effect. Scattered low-density changes within the periventricular and subcortical white matter compatible with chronic microvascular ischemic change. Mild diffuse cerebral volume loss. Vascular: Atherosclerotic calcifications involving the large vessels of the skull base. No unexpected hyperdense vessel. Skull: Normal. Negative for fracture or focal lesion. Sinuses/Orbits: Partial right mastoid effusion. The visualized paranasal sinuses are clear. Other: Mild soft tissue swelling in the right supraorbital region. No scalp hematoma. No radiopaque foreign body. IMPRESSION: 1. No acute intracranial abnormality. 2. Mild soft tissue swelling in the right supraorbital region. No scalp hematoma. No radiopaque foreign body. 3. Partial right mastoid effusion. Electronically Signed   By: Davina Poke D.O.   On: 05/27/2021 14:13   CT Lumbar Spine Wo Contrast  Result Date: 05/27/2021 CLINICAL DATA:  Back trauma, fall EXAM: CT LUMBAR SPINE  WITHOUT CONTRAST TECHNIQUE: Multidetector CT imaging of the lumbar spine was performed without intravenous contrast administration. Multiplanar CT image reconstructions were also generated. RADIATION DOSE REDUCTION: This exam was performed according to the departmental dose-optimization program which includes automated exposure control, adjustment of the mA and/or kV according to patient size and/or use of iterative reconstruction technique. COMPARISON:  None. FINDINGS: Segmentation: 5 lumbar type vertebrae. Alignment: Normal. Vertebrae: Posterior lumbar interbody fusion at L3-L5. Disc spacers at L3-L4 and L4-L5. No acute fracture or focal pathologic process. Paraspinal and other soft tissues: Negative. Disc levels: T12-L1: No significant finding L1-L2: Significant findings L2-L3: Disc osteophyte complex with narrowing of spinal canal and lateral recess narrowing. No neural foraminal  narrowing. L3-L4: Posterior spinal fusion and laminectomy changes. No spinal canal or neural foraminal narrowing. L4-L5: Postsurgical changes. Facet joint arthropathy and osteophytes. Mild right neural foraminal narrowing. L5-S1: Laminectomy changes. Bilateral facet joint arthropathy right greater than the left. Mild right neural foraminal narrowing. IMPRESSION: 1. Posterior spinal fusion at L3-L5. No evidence of acute fracture or subluxation. 2. Disc osteophyte complex with bilateral lateral recess narrowing at L2-L3. 3. Facet joint arthropathy with mild right neural foraminal narrowing at L5-S1. Electronically Signed   By: Keane Police D.O.   On: 05/27/2021 17:15   CT PELVIS WO CONTRAST  Result Date: 05/27/2021 CLINICAL DATA:  Right hip pain after mechanical fall EXAM: CT PELVIS WITHOUT CONTRAST TECHNIQUE: Multidetector CT imaging of the pelvis was performed following the standard protocol without intravenous contrast. RADIATION DOSE REDUCTION: This exam was performed according to the departmental dose-optimization program which includes automated exposure control, adjustment of the mA and/or kV according to patient size and/or use of iterative reconstruction technique. COMPARISON:  X-ray 05/27/2021, CT 07/06/2020 FINDINGS: Urinary Tract:  No abnormality visualized. Bowel:  Unremarkable visualized pelvic bowel loops. Vascular/Lymphatic: Aortoiliac atherosclerosis without aneurysm. No intrapelvic or inguinal lymphadenopathy. Reproductive:  Prostate gland obscured by metallic streak artifact. Other:  No ascites within the pelvis. Musculoskeletal: Status post right total hip arthroplasty. Acute nondisplaced periprosthetic fracture involving the intertrochanteric aspect of the proximal right femur (series 6, images 31-45). There is fullness within the proximal anterior compartment musculature of the right thigh near the fracture site likely a component of posttraumatic hematoma. No evidence of hardware fracture or  dislocation. No periprosthetic lucency. Pelvic bony ring intact without fracture or diastasis. Mild degenerative changes involving the left hip, sacroiliac joints, and pubic symphysis. Mild soft tissue edema about the right hip. No organized fluid collection or hematoma within the soft tissues. IMPRESSION: 1. Acute nondisplaced periprosthetic fracture involving the intertrochanteric aspect of the proximal right femur. 2. Fullness within the proximal anterior compartment musculature of the right thigh near the fracture site, likely a component of posttraumatic hematoma. Aortic Atherosclerosis (ICD10-I70.0). Electronically Signed   By: Davina Poke D.O.   On: 05/27/2021 16:58   DG Chest Port 1 View  Result Date: 05/27/2021 CLINICAL DATA:  Hip pain post fall EXAM: PORTABLE CHEST 1 VIEW COMPARISON:  07/06/2020 FINDINGS: Enlargement of cardiac silhouette. Prominent RIGHT paratracheal soft tissues unchanged question related to vascular prominence. Pulmonary vascularity normal. Atherosclerotic calcification aorta aortic arch. Minimal bibasilar atelectasis. Lungs otherwise clear. No acute infiltrate, pleural effusion, or pneumothorax. IMPRESSION: Minimal bibasilar atelectasis. Mild enlargement of cardiac silhouette. Electronically Signed   By: Lavonia Dana M.D.   On: 05/27/2021 14:33   DG Hip Unilat W or Wo Pelvis 2-3 Views Right  Result Date: 05/27/2021 CLINICAL DATA:  Hip pain post fall EXAM: DG HIP (WITH OR  WITHOUT PELVIS) 2-3V RIGHT COMPARISON:  Pelvic radiograph 07/11/2017 FINDINGS: Interval RIGHT total hip arthroplasty. Osseous mineralization diminished. Slight irregularity at the lateral margin of the junction of the greater trochanter and femoral metaphysis similar to prior CT exam with overlying small rounded ossicle. No definite acute fracture, dislocation, or bone destruction. IMPRESSION: RIGHT hip prosthesis. No definite acute fracture or dislocation. Electronically Signed   By: Lavonia Dana M.D.   On:  05/27/2021 14:38    EKG: Independently reviewed.  Sinus bradycardia with rate 39; nonspecific ST changes with no evidence of acute ischemia   Labs on Admission: I have personally reviewed the available labs and imaging studies at the time of the admission.  Pertinent labs:    Glucose 134 BUN 36/Creatinine 1.82/GFR 35 - stable WBC 9.9 Hgb 11.5 COVID/flu negative    Assessment and Plan: * Periprosthetic hip fracture, initial encounter -Apparently mechanical fall resulting in hip fracture -Orthopedics consulted; Dr. Rhona Raider asked to write a chart note with recommendations -The fracture is periprosthetic but does not involve the joint itself; plan is for WBAT without surgical intervention -Pain control with Tylenol, Robaxin, Oxycodone, and Morphine prn -TOC team consult for rehab placement if needed -PT/OT consults -Hip fracture order set utilized  DNR (do not resuscitate)- (present on admission) -I have discussed code status with the patient and he would not desire resuscitation and would prefer to die a natural death should that situation arise. -He will need a gold out of facility DNR form at the time of discharge  Essential (primary) hypertension- (present on admission) -Continue amlodipine and terazosin -Hold Zestoretic, losartan, and HCTZ due to CKD - would certainly not resume all of these medications at the time of dc -Will hold atenolol due to bradycardia -Will add prn IV hydralazine  Bradycardia- (present on admission) -Patient with an episode of bradycardia to the 30s while in the ER -This could be related to symptomatic bradycardia or could have been a vasovagal episode -Will observe overnight in progressive care -Hold Atenolol -If no further episodes, he is likely ok for dc to home tomorrow -If he has further bradycardia, will consult cardiology  Type 2 diabetes mellitus with renal manifestations ([place])- (present on admission) -Glycemic goal is looser given  advanced age -Hold metformin -Will cover with moderate-scale SSI  Polyneuropathy in diabetes ([place])- (present on admission) -He reports foot burning, need for medication -Resume home neurontin  Chronic kidney disease, stage 3b ([place])- (present on admission) -Advanced CKD which appears to be stable from prior ([facility]) -He appears to be taking both ACE and ARB as well as HCTZ - will hold all and carefully consider whether these are his best options -Will give 50 cc/hr x 10 hours -Will recheck BMP in AM   Mixed hyperlipidemia- (present on admission) -Continue gemfibrozil    Advance Care Planning:   Code Status: DNR   Consults: Orthopedics (telephone only); PT/OT; TOC team; nutrition  DVT Prophylaxis: Lovenox  Family Communication: None present; the patient is capable of communicating with his family at this time  Severity of Illness: The appropriate patient status for this patient is OBSERVATION. Observation status is judged to be reasonable and necessary in order to provide the required intensity of service to ensure the patient's safety. The patient's presenting symptoms, physical exam findings, and initial radiographic and laboratory data in the context of their medical condition is felt to place them at decreased risk for further clinical deterioration. Furthermore, it is anticipated that the patient will be medically stable for  discharge from the hospital within 2 midnights of admission.   Author: Karmen Bongo, MD 05/28/2021 8:38 AM  For on call review [email or web address].

## 2021-05-28 NOTE — Assessment & Plan Note (Signed)
-  Glycemic goal is looser given advanced age -Hold metformin -Will cover with moderate-scale SSI

## 2021-05-28 NOTE — Assessment & Plan Note (Signed)
-  Patient with an episode of bradycardia to the 30s while in the ER -This could be related to symptomatic bradycardia or could have been a vasovagal episode -Will observe overnight in progressive care -Hold Atenolol -If no further episodes, he is likely ok for dc to home tomorrow -If he has further bradycardia, will consult cardiology

## 2021-05-28 NOTE — Assessment & Plan Note (Addendum)
-  Apparently mechanical fall resulting in hip fracture -Orthopedics consulted; Dr. Rhona Raider asked to write a chart note with recommendations -The fracture is periprosthetic but does not involve the joint itself; plan is for WBAT without surgical intervention -Pain control with Tylenol, Robaxin, Oxycodone, and Morphine prn -TOC team consult for rehab placement if needed -PT/OT consults -Hip fracture order set utilized

## 2021-05-28 NOTE — Assessment & Plan Note (Signed)
-  Advanced CKD which appears to be stable from prior ([facility]) -He appears to be taking both ACE and ARB as well as HCTZ - will hold all and carefully consider whether these are his best options -Will give 50 cc/hr x 10 hours -Will recheck BMP in AM

## 2021-05-28 NOTE — Progress Notes (Signed)
I was contacted last night regarding patient's hip fracture. Patient has since been admitted to medicine. I have discussed fracture with our hip specialist, Dr. Mayer Camel. Recommendation is WBAT with PT. Patient may be discharged home when medically stable and when cleared by PT. Patient advised to f/u with his hip surgeon, Dr. [name], on an outpatient basis.

## 2021-05-28 NOTE — Assessment & Plan Note (Signed)
-  Continue amlodipine and terazosin -Hold Zestoretic, losartan, and HCTZ due to CKD - would certainly not resume all of these medications at the time of dc -Will hold atenolol due to bradycardia -Will add prn IV hydralazine

## 2021-05-28 NOTE — ED Notes (Signed)
Report given to [facility]. 

## 2021-05-28 NOTE — Social Work (Signed)
CSW acknowledges consult for SNF/HH. The patient will require PT/OT evaluations. TOC will assist with disposition planning once the evaluations have been completed.  °  °TOC will continue to follow.    °

## 2021-05-28 NOTE — Plan of Care (Signed)

## 2021-05-28 NOTE — Assessment & Plan Note (Signed)
-  Continue gemfibrozil

## 2021-05-28 NOTE — Assessment & Plan Note (Signed)
-  I have discussed code status with the patient and he would not desire resuscitation and would prefer to die a natural death should that situation arise. -He will need a gold out of facility DNR form at the time of discharge

## 2021-05-28 NOTE — Evaluation (Addendum)
Physical Therapy Evaluation Patient Details Name: Joe Byrd MRN: [id number] DOB: 11-Aug-1930 Today's Date: 05/28/2021  History of Present Illness  Pt is a 88 y.o. M who presents 05/27/2021 with a fall and right periprosthetic hip fracture. Plan is for WBAT without surgical intervention. Significant PMH: DM, stage 3b CKD, HTN.  Clinical Impression  PTA, pt lives with his spouse and is independent with a Rollator. Pt is the primary caregiver for his wife, who is 72 y.o. and has dementia. Pt presents with decreased functional mobility secondary to RLE weakness, pain, impaired standing balance. Pt requiring moderate assist for transfers and ambulating x 10 feet with a RW at a min assist level. Pt presents as a high fall risk based on history of falling and decreased gait speed. Will continue to progress mobility as tolerated and reassess.   Recommendations for follow up therapy are one component of a multi-disciplinary discharge planning process, led by the attending physician.  Recommendations may be updated based on patient status, additional functional criteria and insurance authorization.  Follow Up Recommendations Skilled nursing-short term rehab (<3 hours/day) (pt likely to refuse; will need HHPT/OT/aide)    Assistance Recommended at Discharge Frequent or constant Supervision/Assistance  Patient can return home with the following  A lot of help with walking and/or transfers;A lot of help with bathing/dressing/bathroom;Assist for transportation    Equipment Recommendations Rolling walker (2 wheels);BSC/3in1  Recommendations for Other Services       Functional Status Assessment Patient has had a recent decline in their functional status and demonstrates the ability to make significant improvements in function in a reasonable and predictable amount of time.     Precautions / Restrictions Precautions Precautions: Fall Restrictions Weight Bearing Restrictions: Yes RLE Weight Bearing:  Weight bearing as tolerated      Mobility  Bed Mobility Overal bed mobility: Needs Assistance Bed Mobility: Supine to Sit     Supine to sit: Min assist     General bed mobility comments: Assist for RLE, use of bed pad to scoot hips forward    Transfers Overall transfer level: Needs assistance Equipment used: Rolling walker (2 wheels) Transfers: Sit to/from Stand Sit to Stand: Mod assist           General transfer comment: ModA to rise to stand x 2, cues for hand placement    Ambulation/Gait Ambulation/Gait assistance: Min assist Gait Distance (Feet): 10 Feet Assistive device: Rolling walker (2 wheels) Gait Pattern/deviations: Step-to pattern, Decreased stance time - right, Decreased weight shift to right Gait velocity: decreased Gait velocity interpretation: <1.8 ft/sec, indicate of risk for recurrent falls   General Gait Details: Cues for sequencing/technique, use of RW, weight shifting. close chair follow utilized  Financial trader Rankin (Stroke Patients Only)       Balance Overall balance assessment: Needs assistance Sitting-balance support: Feet supported Sitting balance-Leahy Scale: Fair     Standing balance support: Bilateral upper extremity supported Standing balance-Leahy Scale: Poor                               Pertinent Vitals/Pain Pain Assessment Pain Assessment: Faces Faces Pain Scale: Hurts even more Pain Location: R hip, thigh Pain Descriptors / Indicators: Shooting, Grimacing, Guarding Pain Intervention(s): Limited activity within patient's tolerance, Monitored during session, Premedicated before session    Home Living Family/patient expects to be discharged to:: Private  residence Living Arrangements: Spouse/significant other Available Help at Discharge: Friend(s) Type of Home: House Home Access: Level entry       [place]: One [place] - single  point;Other (comment);Shower seat;Rollator (4 wheels);Standard Walker (upright walker) Additional Comments: Pt spouse is 95 y.o. with dementia; friend is helping out in mean time    Prior Function Prior Level of Function : Independent/Modified Independent             Mobility Comments: [facility], uses Rollator community distances       Journalist, newspaper        Extremity/Trunk Assessment   Upper Extremity Assessment Upper Extremity Assessment: Defer to OT evaluation    Lower Extremity Assessment Lower Extremity Assessment: RLE deficits/detail RLE Deficits / Details: Grossly 2/5       Communication   Communication: No difficulties  Cognition Arousal/Alertness: Awake/alert Behavior During Therapy: WFL for tasks assessed/performed Overall Cognitive Status: Within Functional Limits for tasks assessed                                          General Comments      Exercises General Exercises - Lower Extremity Ankle Circles/Pumps: Both, 20 reps, Supine Quad Sets: Both, 10 reps, Supine Long Arc Quad: Both, 10 reps, Seated Heel Slides: AAROM, Right, 5 reps, Supine   Assessment/Plan    PT Assessment Patient needs continued PT services  PT Problem List Decreased strength;Decreased activity tolerance;Decreased balance;Decreased mobility;Pain       PT Treatment Interventions DME instruction;Stair training;Gait training;Functional mobility training;Therapeutic activities;Balance training;Therapeutic exercise;Patient/family education    PT Goals (Current goals can be found in the Care Plan section)  Acute Rehab PT Goals Patient Stated Goal: go home PT Goal Formulation: With patient Time For Goal Achievement: 06/11/21 Potential to Achieve Goals: Good    Frequency Min 5X/week     Co-evaluation               AM-PAC PT "6 Clicks" Mobility  Outcome Measure Help needed turning from your back to your side while in a flat bed without  using bedrails?: A Little Help needed moving from lying on your back to sitting on the side of a flat bed without using bedrails?: A Little Help needed moving to and from a bed to a chair (including a wheelchair)?: A Little Help needed standing up from a chair using your arms (e.g., wheelchair or bedside chair)?: A Lot Help needed to walk in hospital room?: Total Help needed climbing 3-5 steps with a railing? : Total 6 Click Score: 13    End of Session Equipment Utilized During Treatment: Gait belt Activity Tolerance: Patient tolerated treatment well Patient left: in chair;with call bell/phone within reach;with chair alarm set Nurse Communication: Mobility status PT Visit Diagnosis: Unsteadiness on feet (R26.81);Muscle weakness (generalized) (M62.81);Difficulty in walking, not elsewhere classified (R26.2);Pain Pain - Right/Left: Right Pain - part of body: Hip    Time: 9417-4081 PT Time Calculation (min) (ACUTE ONLY): 33 min   Charges:   PT Evaluation $PT Eval Moderate Complexity: 1 Mod PT Treatments $Gait Training: 8-22 mins        Wyona Almas, PT, DPT Acute Rehabilitation Services Pager [phone] Office [phone]   Deno Etienne 05/28/2021, 5:02 PM

## 2021-05-28 NOTE — Assessment & Plan Note (Signed)
-  He reports foot burning, need for medication -Resume home neurontin

## 2021-05-28 NOTE — ED Notes (Signed)
[facility] to bedside to assume care of patient.  Floor nurse Candace notified that patient en route.

## 2021-05-29 DIAGNOSIS — Z20822 Contact with and (suspected) exposure to covid-19: Secondary | ICD-10-CM | POA: Diagnosis present

## 2021-05-29 DIAGNOSIS — M978XXA Periprosthetic fracture around other internal prosthetic joint, initial encounter: Secondary | ICD-10-CM | POA: Diagnosis not present

## 2021-05-29 DIAGNOSIS — Z96649 Presence of unspecified artificial hip joint: Secondary | ICD-10-CM | POA: Diagnosis not present

## 2021-05-29 DIAGNOSIS — W01190A Fall on same level from slipping, tripping and stumbling with subsequent striking against furniture, initial encounter: Secondary | ICD-10-CM | POA: Diagnosis present

## 2021-05-29 DIAGNOSIS — Z7984 Long term (current) use of oral hypoglycemic drugs: Secondary | ICD-10-CM | POA: Diagnosis not present

## 2021-05-29 DIAGNOSIS — R269 Unspecified abnormalities of gait and mobility: Secondary | ICD-10-CM | POA: Diagnosis present

## 2021-05-29 DIAGNOSIS — R262 Difficulty in walking, not elsewhere classified: Secondary | ICD-10-CM | POA: Diagnosis present

## 2021-05-29 DIAGNOSIS — N1832 Chronic kidney disease, stage 3b: Secondary | ICD-10-CM

## 2021-05-29 DIAGNOSIS — Z66 Do not resuscitate: Secondary | ICD-10-CM | POA: Diagnosis present

## 2021-05-29 DIAGNOSIS — K219 Gastro-esophageal reflux disease without esophagitis: Secondary | ICD-10-CM | POA: Diagnosis present

## 2021-05-29 DIAGNOSIS — S0181XA Laceration without foreign body of other part of head, initial encounter: Secondary | ICD-10-CM | POA: Diagnosis present

## 2021-05-29 DIAGNOSIS — M9701XA Periprosthetic fracture around internal prosthetic right hip joint, initial encounter: Secondary | ICD-10-CM | POA: Diagnosis present

## 2021-05-29 DIAGNOSIS — E782 Mixed hyperlipidemia: Secondary | ICD-10-CM | POA: Diagnosis present

## 2021-05-29 DIAGNOSIS — Y92511 Restaurant or cafe as the place of occurrence of the external cause: Secondary | ICD-10-CM | POA: Diagnosis not present

## 2021-05-29 DIAGNOSIS — E1142 Type 2 diabetes mellitus with diabetic polyneuropathy: Secondary | ICD-10-CM | POA: Diagnosis present

## 2021-05-29 DIAGNOSIS — Z79899 Other long term (current) drug therapy: Secondary | ICD-10-CM | POA: Diagnosis not present

## 2021-05-29 DIAGNOSIS — E1122 Type 2 diabetes mellitus with diabetic chronic kidney disease: Secondary | ICD-10-CM | POA: Diagnosis present

## 2021-05-29 DIAGNOSIS — I129 Hypertensive chronic kidney disease with stage 1 through stage 4 chronic kidney disease, or unspecified chronic kidney disease: Secondary | ICD-10-CM | POA: Diagnosis present

## 2021-05-29 DIAGNOSIS — R001 Bradycardia, unspecified: Secondary | ICD-10-CM

## 2021-05-29 DIAGNOSIS — Z87891 Personal history of nicotine dependence: Secondary | ICD-10-CM | POA: Diagnosis not present

## 2021-05-29 DIAGNOSIS — Z888 Allergy status to other drugs, medicaments and biological substances status: Secondary | ICD-10-CM | POA: Diagnosis not present

## 2021-05-29 DIAGNOSIS — Z7982 Long term (current) use of aspirin: Secondary | ICD-10-CM | POA: Diagnosis not present

## 2021-05-29 LAB — CBC
HCT: 25.5 % — ABNORMAL LOW (ref 39.0–52.0)
Hemoglobin: 8.7 g/dL — ABNORMAL LOW (ref 13.0–17.0)
MCH: 30.7 pg (ref 26.0–34.0)
MCHC: 34.1 g/dL (ref 30.0–36.0)
MCV: 90.1 fL (ref 80.0–100.0)
Platelets: 133 10*3/uL — ABNORMAL LOW (ref 150–400)
RBC: 2.83 MIL/uL — ABNORMAL LOW (ref 4.22–5.81)
RDW: 13.1 % (ref 11.5–15.5)
WBC: 6.2 10*3/uL (ref 4.0–10.5)
nRBC: 0 % (ref 0.0–0.2)

## 2021-05-29 LAB — BASIC METABOLIC PANEL
Anion gap: 10 (ref 5–15)
BUN: 30 mg/dL — ABNORMAL HIGH (ref 8–23)
CO2: 24 mmol/L (ref 22–32)
Calcium: 8.9 mg/dL (ref 8.9–10.3)
Chloride: 103 mmol/L (ref 98–111)
Creatinine, Ser: 1.84 mg/dL — ABNORMAL HIGH (ref 0.61–1.24)
GFR, Estimated: 35 mL/min — ABNORMAL LOW (ref >60)
Glucose, Bld: 178 mg/dL — ABNORMAL HIGH (ref 70–99)
Potassium: 3.5 mmol/L (ref 3.5–5.1)
Sodium: 137 mmol/L (ref 135–145)

## 2021-05-29 LAB — GLUCOSE, CAPILLARY
Glucose-Capillary: 147 mg/dL — ABNORMAL HIGH (ref 70–99)
Glucose-Capillary: 171 mg/dL — ABNORMAL HIGH (ref 70–99)
Glucose-Capillary: 161 mg/dL — ABNORMAL HIGH (ref 70–99)
Glucose-Capillary: 202 mg/dL — ABNORMAL HIGH (ref 70–99)

## 2021-05-29 MED ORDER — ENSURE ENLIVE PO LIQD
237.0000 mL | Freq: Two times a day (BID) | ORAL | Status: DC
  Administered 2021-05-30: 237 mL via ORAL

## 2021-05-29 MED ORDER — LOSARTAN POTASSIUM 25 MG PO TABS
25.0000 mg | ORAL_TABLET | Freq: Every day | ORAL | Status: DC
  Administered 2021-05-29 – 2021-05-30 (×2): 25 mg via ORAL
  Filled 2021-05-29 (×2): qty 1

## 2021-05-29 NOTE — TOC Progression Note (Signed)
Transition of Care [facility]) - Progression Note    Patient Details  Name: Joe Byrd MRN: [id number] Date of Birth: 1930-05-05  Transition of Care [facility]) CM/SW [place], [place] Phone Number: [phone] 05/29/2021, 2:43 PM  Clinical Narrative:     CSW met with pt along with RN CM Kristi to ascertain if he was agreeable to go to SNF. Pt staes that he is agreeable and would like to stay close to his residence. Pt prefers [facility] however agreeable to referral being sent to other facilities. CSW answered questions that were posed.CSW provided pt with medicare.gov rating list. Pt does not want to go to a 1 star facility.  CSW updated MD as well son Zenia Resides of pt's new discharge plan.  TOC team will continue to assist with discharge planning needs.   Expected Discharge Plan: [place] Barriers to Discharge: Continued Medical Work up  Expected Discharge Plan and Services Expected Discharge Plan: [place] arrangements for the past 2 months: Single Family Home                                       Social Determinants of Health (SDOH) Interventions    Readmission Risk Interventions No flowsheet data found.

## 2021-05-29 NOTE — Progress Notes (Addendum)
Physical Therapy Treatment Patient Details Name: Joe Byrd MRN: [id number] DOB: 07/25/1930 Today's Date: 05/29/2021   History of Present Illness Pt is a 88 y.o. M who presents 05/27/2021 with a fall and right periprosthetic hip fracture. Plan is for WBAT without surgical intervention. Significant PMH: DM, stage 3b CKD, HTN.    PT Comments    Pt continues to be limited in mobility primarily by R hip pain, impacting his ability to lift his leg or perform any hip AROM without assistance. Pt is requiring min-modA for bed mobility, modA for transfers, and min-modA to take a few steps anterior > posterior with a RW. Encouraged pt to perform R leg AROM to tolerance. Pt now agreeable to SNF for rehab. Current recommendations remain appropriate. Will continue to follow acutely.  BP:  176/76 supine 152/60 sitting 170/140 standing 152/60 supine end of session  *reported lightheadedness intermittently     Recommendations for follow up therapy are one component of a multi-disciplinary discharge planning process, led by the attending physician.  Recommendations may be updated based on patient status, additional functional criteria and insurance authorization.  Follow Up Recommendations  Skilled nursing-short term rehab (<3 hours/day) (if pt refuses, will need HHPT/OT/aide)     Assistance Recommended at Discharge Frequent or constant Supervision/Assistance  Patient can return home with the following A lot of help with walking and/or transfers;A lot of help with bathing/dressing/bathroom;Assist for transportation   Equipment Recommendations  Rolling walker (2 wheels);BSC/3in1;Wheelchair (measurements PT);Wheelchair cushion (measurements PT);Hospital bed (pending progress)    Recommendations for Other Services       Precautions / Restrictions Precautions Precautions: Fall Precaution Comments: monitor BP Restrictions Weight Bearing Restrictions: Yes RLE Weight Bearing: Weight bearing as  tolerated     Mobility  Bed Mobility Overal bed mobility: Needs Assistance Bed Mobility: Supine to Sit, Sit to Supine     Supine to sit: Min assist, HOB elevated Sit to supine: Mod assist   General bed mobility comments: Assist for RLE, use of bed pad to scoot hips forward. ModA to lift legs back to bed.    Transfers Overall transfer level: Needs assistance Equipment used: Rolling walker (2 wheels) Transfers: Sit to/from Stand Sit to Stand: Mod assist           General transfer comment: ModA to rise to stand from EOB with cues to extend trunk and hips.    Ambulation/Gait Ambulation/Gait assistance: Min assist, Mod assist Gait Distance (Feet): 6 Feet Assistive device: Rolling walker (2 wheels) Gait Pattern/deviations: Step-to pattern, Decreased stance time - right, Decreased weight shift to right, Decreased stride length, Trunk flexed, Antalgic Gait velocity: decreased Gait velocity interpretation: <1.31 ft/sec, indicative of household ambulator   General Gait Details: Pt taking a few steps anteriorly and a few posteriorly until fatigued and unable to continue due to pain. Tactile and verbal cues provided to relax R knee to allow it to flex during swing, noted mod success. Difficulty advancing L leg due to resisting weight bearing on R, cued pt to push up through RW to unweight R but pt flexing trunk anteriorly with difficulty pushing through RW. More difficulty stepping posteriorly than anteriorly. Min-modA for stability and RW management.   Stairs             Wheelchair Mobility    Modified Rankin (Stroke Patients Only)       Balance Overall balance assessment: Needs assistance Sitting-balance support: Feet supported Sitting balance-Leahy Scale: Fair     Standing balance support: Bilateral upper  extremity supported Standing balance-Leahy Scale: Poor Standing balance comment: relies on BUE and external support                             Cognition Arousal/Alertness: Awake/alert Behavior During Therapy: WFL for tasks assessed/performed Overall Cognitive Status: Within Functional Limits for tasks assessed                                          Exercises      General Comments General comments (skin integrity, edema, etc.): BP: 176/76 supine, 152/60 sitting, 170/140 standing, 152/60 supine end of session; reported lightheadedness intermittently; encouraged pt to perform AROM of R leg as able      Pertinent Vitals/Pain Pain Assessment Pain Assessment: Faces Faces Pain Scale: Hurts even more Pain Location: R hip, thigh Pain Descriptors / Indicators: Shooting, Grimacing, Guarding Pain Intervention(s): Limited activity within patient's tolerance, Monitored during session, Repositioned    Home Living                          Prior Function            PT Goals (current goals can now be found in the care plan section) Acute Rehab PT Goals Patient Stated Goal: go home PT Goal Formulation: With patient Time For Goal Achievement: 06/11/21 Potential to Achieve Goals: Good Progress towards PT goals: Progressing toward goals    Frequency    Min 5X/week      PT Plan Equipment recommendations need to be updated    Co-evaluation              AM-PAC PT "6 Clicks" Mobility   Outcome Measure  Help needed turning from your back to your side while in a flat bed without using bedrails?: A Little Help needed moving from lying on your back to sitting on the side of a flat bed without using bedrails?: A Little Help needed moving to and from a bed to a chair (including a wheelchair)?: A Lot Help needed standing up from a chair using your arms (e.g., wheelchair or bedside chair)?: A Lot Help needed to walk in hospital room?: Total Help needed climbing 3-5 steps with a railing? : Total 6 Click Score: 12    End of Session Equipment Utilized During Treatment: Gait belt Activity  Tolerance: Patient limited by pain Patient left: with call bell/phone within reach;in bed;with bed alarm set Nurse Communication: Mobility status;Other (comment) (IV leaking blood with BP measurements) PT Visit Diagnosis: Unsteadiness on feet (R26.81);Muscle weakness (generalized) (M62.81);Difficulty in walking, not elsewhere classified (R26.2);Pain;Other abnormalities of gait and mobility (R26.89) Pain - Right/Left: Right Pain - part of body: Hip     Time: 1527-1550 PT Time Calculation (min) (ACUTE ONLY): 23 min  Charges:  $Gait Training: 8-22 mins $Therapeutic Activity: 8-22 mins                     Moishe Spice, PT, DPT Acute Rehabilitation Services  Pager: [phone] Office: [place] 05/29/2021, 4:01 PM

## 2021-05-29 NOTE — TOC Initial Note (Signed)
Transition of Care [facility]) - Initial/Assessment Note    Patient Details  Name: Joe Byrd MRN: [id number] Date of Birth: 16-Dec-1930  Transition of Care [facility]) CM/SW Contact:    Bary Castilla, LCSW Phone Number:[phone] 05/29/2021, 11:01 AM  Clinical Narrative:                 CSW met with patient to discuss PT recommendation of a SNF. Patient was aware of recommendation and pt declined SNF. Pt informed CSW that he is his wife primary caregiver and would like to go home wih [place]. Pt stated that he has DME a home and he would like to try [facility].  MD was alerted of pt's discharge plan and want to follow up with with son.  TOC team will continue to assist with discharge planning needs.      Expected Discharge Plan: [place] Barriers to Discharge: Continued Medical Work up   Patient Goals and CMS Choice Patient states their goals for this hospitalization and ongoing recovery are:: Needs to return home      Expected Discharge Plan and Services Expected Discharge Plan: [place]       Living arrangements for the past 2 months: Single Family Home                                      Prior Living Arrangements/Services Living arrangements for the past 2 months: Single Family Home Lives with:: Self, Spouse Patient language and need for interpreter reviewed:: Yes Do you feel safe going back to the place where you live?: Yes        Care giver support system in place?: Yes (comment)      Activities of Daily Living Home Assistive Devices/Equipment: Eyeglasses, Cane (specify quad or straight), Wheelchair ADL Screening (condition at time of admission) Patient's cognitive ability adequate to safely complete daily activities?: Yes Is the patient deaf or have difficulty hearing?: No Does the patient have difficulty seeing, even when wearing glasses/contacts?: No Does the patient have difficulty concentrating, remembering, or making  decisions?: No Patient able to express need for assistance with ADLs?: Yes Does the patient have difficulty dressing or bathing?: Yes Independently performs ADLs?: No Communication: Independent Dressing (OT): Dependent Is this a change from baseline?: Change from baseline, expected to last >3 days Grooming: Needs assistance Is this a change from baseline?: Change from baseline, expected to last >3 days Feeding: Independent Bathing: Dependent Is this a change from baseline?: Change from baseline, expected to last >3 days Toileting: Dependent Is this a change from baseline?: Change from baseline, expected to last >3days In/Out Bed: Dependent Is this a change from baseline?: Change from baseline, expected to last >3 days Walks in Home: Dependent Is this a change from baseline?: Change from baseline, expected to last >3 days Does the patient have difficulty walking or climbing stairs?: Yes Weakness of Legs: Right Weakness of Arms/Hands: None  Permission Sought/Granted   Permission granted to share information with : Yes, Verbal Permission Granted  Share Information with NAME: Jinny Sanders  Permission granted to share info w AGENCY: [place] granted to share info w Relationship: Spouse  Permission granted to share info w Contact Information: [phone]  Emotional Assessment Appearance:: Appears stated age Attitude/Demeanor/Rapport: Engaged Affect (typically observed): Accepting Orientation: : Oriented to Self, Oriented to Place, Oriented to  Time, Oriented  to Situation      Admission diagnosis:  Bradycardia [R00.1] Right hip pain [M25.551] Facial laceration, initial encounter [S01.81XA] Patient Active Problem List   Diagnosis Date Noted   Periprosthetic hip fracture, initial encounter 05/28/2021   Essential (primary) hypertension 05/28/2021   DNR (do not resuscitate) 05/28/2021   Gout, unspecified 05/27/2021   Obesity 05/27/2021   Knee pain 05/27/2021   Encounter for  fitting and adjustment of hearing aid 05/27/2021   Encounter for general adult medical examination with abnormal findings 05/27/2021   Other reduced mobility 05/27/2021   Sensorineural hearing loss 05/27/2021   Tinnitus 05/27/2021   Bradycardia 05/27/2021   Left leg weakness 02/02/2021   Seborrheic keratoses 11/12/2020   Blood loss anemia 07/06/2020   Elevated troponin 07/06/2020   Hematuria 07/06/2020   Hypokalemia 07/06/2020   Syncope 07/06/2020   Synovial cyst of lumbar spine 03/31/2020   History of progressive weakness 03/02/2020   Myelopathy ([place]) 03/02/2020   S/P evacuation of hematoma 01/06/2019   History of lumbar laminectomy for spinal cord decompression 08/09/2018   S/P lumbar fusion 05/30/2018   Status post total hip replacement, right 08/01/2017   Unilateral primary osteoarthritis, right hip 07/04/2017   Chronic pain disorder 05/16/2017   Lumbar degenerative disc disease 04/12/2017   Degenerative spondylolisthesis 02/16/2017   Mixed hyperlipidemia 04/01/2015   Peripheral neuropathy 04/01/2015   Hammer toes of both feet 04/01/2015   History of colon polyps 04/01/2015   Osteoarthritis of both knees 04/01/2015   Acute bursitis of right shoulder 01/16/2014   Basal cell carcinoma of skin, unspecified 01/16/2014   BPH without obstruction/lower urinary tract symptoms 01/16/2014   Chronic kidney disease, stage 3b ([place]) 01/16/2014   ED (erectile dysfunction) of organic origin 01/16/2014   Feeling weak 01/16/2014   Gastroesophageal reflux disease without esophagitis 01/16/2014   Glaucoma 01/16/2014   Gouty arthropathy 01/16/2014   Hallux limitus 01/16/2014   Impingement syndrome of right shoulder region 01/16/2014   Osteoarthritis 01/16/2014   Polyneuropathy in diabetes ([place]) 01/16/2014   Shoulder pain, right 01/16/2014   Type 2 diabetes mellitus with renal manifestations ([place]) 01/16/2014   Chronic rhinitis 08/30/2011   Deviated nasal septum 08/30/2011   PCP:   Burman Freestone, MD Pharmacy:   [place],  - [address] [place] [address] Phone: [phone] Fax: [phone]  [facility] ([facility] ) - [place] [place] [address] Phone: [phone] Fax: [phone]     Social Determinants of Health (SDOH) Interventions    Readmission Risk Interventions No flowsheet data found.

## 2021-05-29 NOTE — Progress Notes (Addendum)
PROGRESS NOTE    Joe Byrd  BJS:[id number] DOB: 08/14/1930 DOA: 05/27/2021 PCP: Burman Freestone, MD   Chief Complaint  Patient presents with   Laceration    Brief Narrative:   Joe Byrd is a 88 y.o. male with medical history significant of DM, stage 3b CKD, and HTN presenting with a fall.   He reports that he was going to eat lunch at [name] and he was carrying his lunch back to the table and he collapsed. He hit the table with his forehead.  Tells me today he did tripped on something, he denies any loss of consciousness,  He wanted to get up abut he couldn't bear weight on his R leg.  Hip replacement was about 8 years ago,  He has not been dizzy, light-headed, SOB - until he sat up at the ER and got light-headed x 1.  He was noted to have an episode of bradycardia where his heart rate in the 30s for several minutes, improving back to the 60s without intervention, he was admitted for further work-up, monitored on telemetry, no malignant arrhythmias overnight, no pauses or malignant heart block.  Seen by orthopedic with recommendation for nonweightbearing as tolerated and nonoperative management, he was seen by PT /OT with recommendation for SNF placement      Periprosthetic hip fracture, -Apparently mechanical fall resulting in hip fracture -Orthopedic input greatly appreciated, recommendation for weightbearing as tolerated with PT, and is advised to follow-up with his hip surgeon Dr. [name] as outpatient basis. -Pain control with Tylenol, Robaxin, Oxycodone, and Morphine prn -TOC team consult for rehab placement  -PT/OT consults -Hip fracture order set utilized  Ambulatory dysfunction -Patient with recent fall, and periprosthetic hip fracture, has seen by PT, OT, very unsteady, he will need rehab placement   DNR (do not resuscitate)- (present on admission) -Meeting physician discussed code status with the patient and he would not desire resuscitation and would prefer to  die a natural death should that situation arise. -He will need a gold out of facility DNR form at the time of discharge   Essential (primary) hypertension- (present on admission) -We will discontinue his atenolol due to to bradycardia while in ER -It appears to be on both losartan and lisinopril with hydrochlorothiazide, I will discontinue lisinopril and hydrochlorothiazide. -Resumed on amlodipine and losartan on admission, blood pressure remains uncontrolled so I will resume back on losartan. -Continue with prn IV hydralazine   Bradycardia- (present on admission) -Patient with an episode of bradycardia to the 30s while in the ER -could have been a vasovagal episode -Hold Atenolol    Type 2 diabetes mellitus with renal manifestations ([place])- (present on admission) -Glycemic goal is looser given advanced age -Hold metformin -Will cover with moderate-scale SSI   Polyneuropathy in diabetes ([place])- (present on admission) -He reports foot burning, need for medication -Resume home neurontin   Chronic kidney disease, stage 3b ([place])- (present on admission) -Advanced CKD which appears to be stable from prior (CareEverywhere) -He appears to be taking both ACE and ARB as well as HCTZ - will hold all and carefully consider whether these are his best options -Continue lisinopril and hydrochlorothiazide -We will monitor on losartan.     Mixed hyperlipidemia- (present on admission) -Continue gemfibrozil   Assessment & Plan:   Principal Problem:   Periprosthetic hip fracture, initial encounter Active Problems:   Mixed hyperlipidemia   Chronic kidney disease, stage 3b (HCC)   Polyneuropathy in diabetes (HCC)   Type 2 diabetes  mellitus with renal manifestations ([place])   Bradycardia   Essential (primary) hypertension   DNR (do not resuscitate)   Ambulatory dysfunction      DVT prophylaxis: Lovenox Code Status: DNR Family Communication: None at bedside Disposition: Patient will need  SNF placement.  Status is: inpatient The patient will require care spanning > 2 midnights and should be moved to inpatient because: Severe ambulatory dysfunction  Patient will need SNF placment      Consultants:  none   Subjective:  Patient reports generalized weakness, and fatigue, discussed with OT, he was dizzy, lightheaded upon standing today.  Objective: Vitals:   05/28/21 2304 05/29/21 0316 05/29/21 0743 05/29/21 1200  BP: (!) 125/52 (!) 151/65 (!) 165/63 (!) 150/61  Pulse: 65 61 67 69  Resp: 20 16 (!) 23 19  Temp: 97.9 F (36.6 C) (!) 97.3 F (36.3 C)  97.9 F (36.6 C)  TempSrc: Oral Oral  Oral  SpO2: 95% 98% 94% 98%  Weight:      Height:       No intake or output data in the 24 hours ending 05/29/21 1523  Filed Weights   05/27/21 1311 05/28/21 0637  Weight: 87.5 kg 85.3 kg    Examination:  Awake Alert, Oriented X 3, frail, deconditioned and hard of hearing with significant bruising around the right eye. Symmetrical Chest wall movement, Good air movement bilaterally, CTAB RRR,No Gallops,Rubs or new Murmurs, No Parasternal Heave +ve B.Sounds, Abd Soft, No tenderness, No rebound - guarding or rigidity. No Cyanosis, Clubbing or edema, No new Rash or bruise      Data Reviewed: I have personally reviewed following labs and imaging studies  CBC: Recent Labs  Lab 05/27/21 1452 05/29/21 0058  WBC 9.9 6.2  NEUTROABS 7.8*  --   HGB 11.5* 8.7*  HCT 32.8* 25.5*  MCV 89.1 90.1  PLT 169 133*    Basic Metabolic Panel: Recent Labs  Lab 05/27/21 1452 05/29/21 0058  NA 136 137  K 3.9 3.5  CL 101 103  CO2 23 24  GLUCOSE 134* 178*  BUN 36* 30*  CREATININE 1.82* 1.84*  CALCIUM 9.1 8.9    GFR: Estimated Creatinine Clearance: 28.7 mL/min (A) (by C-G formula based on SCr of 1.84 mg/dL (H)).  Liver Function Tests: No results for input(s): AST, ALT, ALKPHOS, BILITOT, PROT, ALBUMIN in the last 168 hours.  CBG: Recent Labs  Lab 05/28/21 1100  05/28/21 1631 05/28/21 2109 05/29/21 0608 05/29/21 1138  GLUCAP 155* 194* 175* 147* 171*     Recent Results (from the past 240 hour(s))  Resp Panel by RT-PCR (Flu A&B, Covid) Nasopharyngeal Swab     Status: None   Collection Time: 05/27/21  7:21 PM   Specimen: Nasopharyngeal Swab; Nasopharyngeal(NP) swabs in vial transport medium  Result Value Ref Range Status   SARS Coronavirus 2 by RT PCR NEGATIVE NEGATIVE Final    Comment: (NOTE) SARS-CoV-2 target nucleic acids are NOT DETECTED.  The SARS-CoV-2 RNA is generally detectable in upper respiratory specimens during the acute phase of infection. The lowest concentration of SARS-CoV-2 viral copies this assay can detect is 138 copies/mL. A negative result does not preclude SARS-Cov-2 infection and should not be used as the sole basis for treatment or other patient management decisions. A negative result may occur with  improper specimen collection/handling, submission of specimen other than nasopharyngeal swab, presence of viral mutation(s) within the areas targeted by this assay, and inadequate number of viral copies(<138 copies/mL). A negative result must  be combined with clinical observations, patient history, and epidemiological information. The expected result is Negative.  Fact Sheet for Patients:  EntrepreneurPulse.com.au  Fact Sheet for Healthcare Providers:  IncredibleEmployment.be  This test is no t yet approved or cleared by the [place] FDA and  has been authorized for detection and/or diagnosis of SARS-CoV-2 by FDA under an Emergency Use Authorization (EUA). This EUA will remain  in effect (meaning this test can be used) for the duration of the COVID-19 declaration under Section 564(b)(1) of the Act, 21 U.S.C.section 360bbb-3(b)(1), unless the authorization is terminated  or revoked sooner.       Influenza A by PCR NEGATIVE NEGATIVE Final   Influenza B by PCR NEGATIVE  NEGATIVE Final    Comment: (NOTE) The Xpert Xpress SARS-CoV-2/FLU/RSV plus assay is intended as an aid in the diagnosis of influenza from Nasopharyngeal swab specimens and should not be used as a sole basis for treatment. Nasal washings and aspirates are unacceptable for Xpert Xpress SARS-CoV-2/FLU/RSV testing.  Fact Sheet for Patients: EntrepreneurPulse.com.au  Fact Sheet for Healthcare Providers: IncredibleEmployment.be  This test is not yet approved or cleared by the [place] FDA and has been authorized for detection and/or diagnosis of SARS-CoV-2 by FDA under an Emergency Use Authorization (EUA). This EUA will remain in effect (meaning this test can be used) for the duration of the COVID-19 declaration under Section 564(b)(1) of the Act, 21 U.S.C. section 360bbb-3(b)(1), unless the authorization is terminated or revoked.  Performed at [facility], [address], [place], [place] [address]          Radiology Studies: CT Lumbar Spine Wo Contrast  Result Date: 05/27/2021 CLINICAL DATA:  Back trauma, fall EXAM: CT LUMBAR SPINE WITHOUT CONTRAST TECHNIQUE: Multidetector CT imaging of the lumbar spine was performed without intravenous contrast administration. Multiplanar CT image reconstructions were also generated. RADIATION DOSE REDUCTION: This exam was performed according to the departmental dose-optimization program which includes automated exposure control, adjustment of the mA and/or kV according to patient size and/or use of iterative reconstruction technique. COMPARISON:  None. FINDINGS: Segmentation: 5 lumbar type vertebrae. Alignment: Normal. Vertebrae: Posterior lumbar interbody fusion at L3-L5. Disc spacers at L3-L4 and L4-L5. No acute fracture or focal pathologic process. Paraspinal and other soft tissues: Negative. Disc levels: T12-L1: No significant finding L1-L2: Significant findings L2-L3: Disc osteophyte complex  with narrowing of spinal canal and lateral recess narrowing. No neural foraminal narrowing. L3-L4: Posterior spinal fusion and laminectomy changes. No spinal canal or neural foraminal narrowing. L4-L5: Postsurgical changes. Facet joint arthropathy and osteophytes. Mild right neural foraminal narrowing. L5-S1: Laminectomy changes. Bilateral facet joint arthropathy right greater than the left. Mild right neural foraminal narrowing. IMPRESSION: 1. Posterior spinal fusion at L3-L5. No evidence of acute fracture or subluxation. 2. Disc osteophyte complex with bilateral lateral recess narrowing at L2-L3. 3. Facet joint arthropathy with mild right neural foraminal narrowing at L5-S1. Electronically Signed   By: Keane Police D.O.   On: 05/27/2021 17:15   CT PELVIS WO CONTRAST  Result Date: 05/27/2021 CLINICAL DATA:  Right hip pain after mechanical fall EXAM: CT PELVIS WITHOUT CONTRAST TECHNIQUE: Multidetector CT imaging of the pelvis was performed following the standard protocol without intravenous contrast. RADIATION DOSE REDUCTION: This exam was performed according to the departmental dose-optimization program which includes automated exposure control, adjustment of the mA and/or kV according to patient size and/or use of iterative reconstruction technique. COMPARISON:  X-ray 05/27/2021, CT 07/06/2020 FINDINGS: Urinary Tract:  No abnormality  visualized. Bowel:  Unremarkable visualized pelvic bowel loops. Vascular/Lymphatic: Aortoiliac atherosclerosis without aneurysm. No intrapelvic or inguinal lymphadenopathy. Reproductive:  Prostate gland obscured by metallic streak artifact. Other:  No ascites within the pelvis. Musculoskeletal: Status post right total hip arthroplasty. Acute nondisplaced periprosthetic fracture involving the intertrochanteric aspect of the proximal right femur (series 6, images 31-45). There is fullness within the proximal anterior compartment musculature of the right thigh near the fracture site  likely a component of posttraumatic hematoma. No evidence of hardware fracture or dislocation. No periprosthetic lucency. Pelvic bony ring intact without fracture or diastasis. Mild degenerative changes involving the left hip, sacroiliac joints, and pubic symphysis. Mild soft tissue edema about the right hip. No organized fluid collection or hematoma within the soft tissues. IMPRESSION: 1. Acute nondisplaced periprosthetic fracture involving the intertrochanteric aspect of the proximal right femur. 2. Fullness within the proximal anterior compartment musculature of the right thigh near the fracture site, likely a component of posttraumatic hematoma. Aortic Atherosclerosis (ICD10-I70.0). Electronically Signed   By: Davina Poke D.O.   On: 05/27/2021 16:58        Scheduled Meds:  allopurinol  150 mg Oral Daily   amLODipine  10 mg Oral Daily   aspirin EC  81 mg Oral Daily   docusate sodium  100 mg Oral BID   enoxaparin (LOVENOX) injection  30 mg Subcutaneous Q24H   feeding supplement  237 mL Oral BID BM   gabapentin  300 mg Oral TID   gemfibrozil  600 mg Oral BID AC   insulin aspart  0-15 Units Subcutaneous TID WC   insulin aspart  0-5 Units Subcutaneous QHS   losartan  25 mg Oral Daily   pantoprazole  40 mg Oral Daily   sodium chloride flush  3 mL Intravenous Q12H   terazosin  5 mg Oral QHS   Continuous Infusions:   LOS: 0 days      Phillips Climes, MD [facility]   To contact the attending provider between 7A-7P or the covering provider during after hours 7P-7A, please log into the web site [email or web address] and access using universal [place] password for that web site. If you do not have the password, please call the hospital operator.  05/29/2021, 3:23 PM

## 2021-05-29 NOTE — Care Management Obs Status (Signed)
[place] NOTIFICATION   Patient Details  Name: Joe Byrd MRN: [id number] Date of Birth: 08-16-1930   Medicare Observation Status Notification Given:  Yes    Dawayne Patricia, RN 05/29/2021, 2:14 PM

## 2021-05-29 NOTE — TOC Progression Note (Signed)
Transition of Care [facility]) - Progression Note    Patient Details  Name: Jarquez Mestre MRN: [id number] Date of Birth: 02-Apr-1931  Transition of Care [facility]) CM/SW [place], [place] Phone Number: [phone] 05/29/2021, 4:02 PM  Clinical Narrative:     CSW started pt's authorization for a start date of 05/30/2021. Reference number [id number]. Facility choice will need to be given.  TOC team will continue to assist with discharge planning needs.    Expected Discharge Plan: [place] Barriers to Discharge: Continued Medical Work up  Expected Discharge Plan and Services Expected Discharge Plan: [place] arrangements for the past 2 months: Single Family Home                                       Social Determinants of Health (SDOH) Interventions    Readmission Risk Interventions No flowsheet data found.

## 2021-05-29 NOTE — Progress Notes (Signed)
Initial Nutrition Assessment  DOCUMENTATION CODES:   Not applicable  INTERVENTION:  Provide Ensure Enlive po BID, each supplement provides 350 kcal and 20 grams of protein.  Encourage PO intake.   NUTRITION DIAGNOSIS:   Increased nutrient needs related to acute illness as evidenced by estimated needs.  GOAL:   Patient will meet greater than or equal to 90% of their needs  MONITOR:   PO intake, Supplement acceptance, Skin, Weight trends, Labs, I & O's  REASON FOR ASSESSMENT:   Consult Assessment of nutrition requirement/status  ASSESSMENT:   88 y.o. male with medical history significant of DM, stage 3b CKD, and HTN presenting after a fall. Pt with periprosthetic hip fracture.  Per orthopedics, WBAT with PT, no surgery. RD unable to obtain pt nutrition history at this time. RD to order nutritional supplements to aid in caloric and protein needs. Unable to complete Nutrition-Focused physical exam at this time.   Labs and medications reviewed.   Diet Order:   Diet Order             Diet heart healthy/carb modified Room service appropriate? Yes; Fluid consistency: Thin  Diet effective now                   EDUCATION NEEDS:   Not appropriate for education at this time  Skin:  Skin Assessment: Skin Integrity Issues: Skin Integrity Issues:: Other (Comment) Other: laceration to face  Last BM:  2/8  Height:   Ht Readings from Last 1 Encounters:  05/28/21 5\' 10"  (1.778 m)    Weight:   Wt Readings from Last 1 Encounters:  05/28/21 85.3 kg    BMI:  Body mass index is 26.98 kg/m.  Estimated Nutritional Needs:   Kcal:  1850-2000  Protein:  85-95 grams  Fluid:  >/= 1.8 L/day  Corrin Parker, MS, RD, LDN RD pager number/after hours weekend pager number on Amion.

## 2021-05-29 NOTE — NC FL2 (Signed)
[place] MEDICAID FL2 LEVEL OF CARE SCREENING TOOL     IDENTIFICATION  Patient Name: Joe Byrd Birthdate: Jul 14, 1930 Sex: male Admission Date (Current Location): 05/27/2021  [facility] and [place] Number:  Herbalist and Address:  [facility]. [facility], [place] [address] [place], [place] [address]      Provider Number: [id number]  Attending Physician Name and Address:  Elgergawy, Silver Huguenin, MD  Relative Name and Phone Number:  Zenia Resides [phone]    Current Level of Care: Hospital Recommended Level of Care: [place] Prior Approval Number:    Date Approved/Denied:   PASRR Number: [id number]  Discharge Plan: SNF    Current Diagnoses: Patient Active Problem List   Diagnosis Date Noted   Periprosthetic hip fracture, initial encounter 05/28/2021   Essential (primary) hypertension 05/28/2021   DNR (do not resuscitate) 05/28/2021   Gout, unspecified 05/27/2021   Obesity 05/27/2021   Knee pain 05/27/2021   Encounter for fitting and adjustment of hearing aid 05/27/2021   Encounter for general adult medical examination with abnormal findings 05/27/2021   Other reduced mobility 05/27/2021   Sensorineural hearing loss 05/27/2021   Tinnitus 05/27/2021   Bradycardia 05/27/2021   Left leg weakness 02/02/2021   Seborrheic keratoses 11/12/2020   Blood loss anemia 07/06/2020   Elevated troponin 07/06/2020   Hematuria 07/06/2020   Hypokalemia 07/06/2020   Syncope 07/06/2020   Synovial cyst of lumbar spine 03/31/2020   History of progressive weakness 03/02/2020   Myelopathy ([place]) 03/02/2020   S/P evacuation of hematoma 01/06/2019   History of lumbar laminectomy for spinal cord decompression 08/09/2018   S/P lumbar fusion 05/30/2018   Status post total hip replacement, right 08/01/2017   Unilateral primary osteoarthritis, right hip 07/04/2017   Chronic pain disorder 05/16/2017   Lumbar degenerative disc disease 04/12/2017   Degenerative  spondylolisthesis 02/16/2017   Mixed hyperlipidemia 04/01/2015   Peripheral neuropathy 04/01/2015   Hammer toes of both feet 04/01/2015   History of colon polyps 04/01/2015   Osteoarthritis of both knees 04/01/2015   Acute bursitis of right shoulder 01/16/2014   Basal cell carcinoma of skin, unspecified 01/16/2014   BPH without obstruction/lower urinary tract symptoms 01/16/2014   Chronic kidney disease, stage 3b ([place]) 01/16/2014   ED (erectile dysfunction) of organic origin 01/16/2014   Feeling weak 01/16/2014   Gastroesophageal reflux disease without esophagitis 01/16/2014   Glaucoma 01/16/2014   Gouty arthropathy 01/16/2014   Hallux limitus 01/16/2014   Impingement syndrome of right shoulder region 01/16/2014   Osteoarthritis 01/16/2014   Polyneuropathy in diabetes ([place]) 01/16/2014   Shoulder pain, right 01/16/2014   Type 2 diabetes mellitus with renal manifestations ([place]) 01/16/2014   Chronic rhinitis 08/30/2011   Deviated nasal septum 08/30/2011    Orientation RESPIRATION BLADDER Height & Weight     Self, Time, Situation, Place  Normal Continent, External catheter Weight: 188 lb 0.8 oz (85.3 kg) Height:  5\' 10"  (177.8 cm)  BEHAVIORAL SYMPTOMS/MOOD NEUROLOGICAL BOWEL NUTRITION STATUS      Continent Diet (See DC summary)  AMBULATORY STATUS COMMUNICATION OF NEEDS Skin   Extensive Assist Verbally Skin abrasions, Bruising (abrasions( right head))                       Personal Care Assistance Level of Assistance  Bathing, Feeding, Dressing Bathing Assistance: Limited assistance Feeding assistance: Independent Dressing Assistance: Maximum assistance     Functional Limitations Info  Sight, Hearing, Speech Sight Info: Impaired  Hearing Info: Impaired Speech Info: Adequate    SPECIAL CARE FACTORS FREQUENCY  PT (By licensed PT), OT (By licensed OT)     PT Frequency: 5x per week OT Frequency: 5x per week            Contractures Contractures Info: Not present     Additional Factors Info  Code Status, Allergies, Insulin Sliding Scale Code Status Info: DNR Allergies Info: Atorvastatin   Insulin Sliding Scale Info: See DC summary       Current Medications (05/29/2021):  This is the current hospital active medication list Current Facility-Administered Medications  Medication Dose Route Frequency Provider Last Rate Last Admin   acetaminophen (TYLENOL) tablet 650 mg  650 mg Oral Q6H PRN Karmen Bongo, MD   650 mg at 05/29/21 2229   Or   acetaminophen (TYLENOL) suppository 650 mg  650 mg Rectal Q6H PRN Karmen Bongo, MD       allopurinol (ZYLOPRIM) tablet 150 mg  150 mg Oral Daily Karmen Bongo, MD   150 mg at 05/29/21 0909   amLODipine (NORVASC) tablet 10 mg  10 mg Oral Daily Karmen Bongo, MD   10 mg at 05/29/21 7989   aspirin EC tablet 81 mg  81 mg Oral Daily Karmen Bongo, MD   81 mg at 05/29/21 2119   bisacodyl (DULCOLAX) EC tablet 5 mg  5 mg Oral Daily PRN Karmen Bongo, MD       docusate sodium (COLACE) capsule 100 mg  100 mg Oral BID Karmen Bongo, MD   100 mg at 05/29/21 0909   enoxaparin (LOVENOX) injection 30 mg  30 mg Subcutaneous Q24H Karmen Bongo, MD   30 mg at 05/29/21 0910   feeding supplement (ENSURE ENLIVE / ENSURE PLUS) liquid 237 mL  237 mL Oral BID BM Elgergawy, Silver Huguenin, MD       gabapentin (NEURONTIN) capsule 300 mg  300 mg Oral TID Karmen Bongo, MD   300 mg at 05/29/21 0909   gemfibrozil (LOPID) tablet 600 mg  600 mg Oral BID Meliton Rattan, MD   600 mg at 05/29/21 4174   hydrALAZINE (APRESOLINE) injection 5 mg  5 mg Intravenous Q4H PRN Karmen Bongo, MD       insulin aspart (novoLOG) injection 0-15 Units  0-15 Units Subcutaneous TID [facility] Karmen Bongo, MD   3 Units at 05/29/21 1208   insulin aspart (novoLOG) injection 0-5 Units  0-5 Units Subcutaneous QHS Karmen Bongo, MD       losartan (COZAAR) tablet 25 mg  25 mg Oral Daily Elgergawy, Silver Huguenin, MD   25 mg at 05/29/21 0744   morphine (PF) 2 MG/ML  injection 2 mg  2 mg Intravenous Q2H PRN Karmen Bongo, MD       ondansetron [facility]) tablet 4 mg  4 mg Oral Q6H PRN Karmen Bongo, MD       Or   ondansetron [facility]) injection 4 mg  4 mg Intravenous Q6H PRN Karmen Bongo, MD       oxyCODONE (Oxy IR/ROXICODONE) immediate release tablet 5 mg  5 mg Oral Q4H PRN Karmen Bongo, MD   5 mg at 05/29/21 0744   pantoprazole (PROTONIX) EC tablet 40 mg  40 mg Oral Daily Karmen Bongo, MD   40 mg at 05/29/21 0909   polyethylene glycol (MIRALAX / GLYCOLAX) packet 17 g  17 g Oral Daily PRN Karmen Bongo, MD       sodium chloride flush (NS) 0.9 % injection 3 mL  3  mL Intravenous Lillia [place], MD       terazosin (HYTRIN) capsule 5 mg  5 mg Oral QHS Karmen Bongo, MD   5 mg at 05/28/21 2205     Discharge Medications: Please see discharge summary for a list of discharge medications.  Relevant Imaging Results:  Relevant Lab Results:   Additional Information SSN# [place], [place]

## 2021-05-29 NOTE — Evaluation (Signed)
Occupational Therapy Evaluation Patient Details Name: Joe Byrd MRN: [id number] DOB: Jan 04, 1931 Today's Date: 05/29/2021   History of Present Illness Pt is a 88 y.o. M who presents 05/27/2021 with a fall and right periprosthetic hip fracture. Plan is for WBAT without surgical intervention. Significant PMH: DM, stage 3b CKD, HTN.   Clinical Impression   PTA patient independent with mobility and ADLs using rollator, caring for his wife and completing all IADLs (driving, cooking).  Admitted for above and limited by pain, decreased activity tolerance, impaired balance and weakness.  He currently requires up to max assist for LB ADLs, mod assist for transfers using RW and setup for UB ADLs.  Vitals assessed as below, pt transferring to recliner and reports dizziness when stepping backwards but unable to get standing BP due to decreased standing tolerance, balance and dizziness.  Believe he will benefit from continued OT services while admitted and after dc at SNF level to optimize independence, safety and return to PLOF.  Will follow.   BP supine: 139/54 (81) BP EOB: 138/59 (80) BP post standing in chair sitting: 135/49 (71)   Recommendations for follow up therapy are one component of a multi-disciplinary discharge planning process, led by the attending physician.  Recommendations may be updated based on patient status, additional functional criteria and insurance authorization.   Follow Up Recommendations  Skilled nursing-short term rehab (<3 hours/day)    Assistance Recommended at Discharge Frequent or constant Supervision/Assistance  Patient can return home with the following A lot of help with walking and/or transfers;A lot of help with bathing/dressing/bathroom;Assistance with cooking/housework;Assist for transportation;Help with stairs or ramp for entrance    Functional Status Assessment  Patient has had a recent decline in their functional status and demonstrates the ability to make  significant improvements in function in a reasonable and predictable amount of time.  Equipment Recommendations  None recommended by OT (has needed equipment)    Recommendations for Other Services       Precautions / Restrictions Precautions Precautions: Fall Restrictions Weight Bearing Restrictions: Yes RLE Weight Bearing: Weight bearing as tolerated      Mobility Bed Mobility Overal bed mobility: Needs Assistance Bed Mobility: Supine to Sit     Supine to sit: Min assist     General bed mobility comments: initation of R LE movement, increased time    Transfers                          Balance Overall balance assessment: Needs assistance Sitting-balance support: No upper extremity supported, Feet supported Sitting balance-Leahy Scale: Fair     Standing balance support: Bilateral upper extremity supported, During functional activity Standing balance-Leahy Scale: Poor Standing balance comment: relies on BUE and external support                           ADL either performed or assessed with clinical judgement   ADL Overall ADL's : Needs assistance/impaired     Grooming: Set up;Sitting           Upper Body Dressing : Set up;Sitting   Lower Body Dressing: Sit to/from stand;Maximal assistance Lower Body Dressing Details (indicate cue type and reason): reqires assist for socks, mod assist to stand and relies on BUE support Toilet Transfer: Moderate assistance;Stand-pivot;Rolling walker (2 wheels) Toilet Transfer Details (indicate cue type and reason): simulated to recliner Toileting- Clothing Manipulation and Hygiene: Total assistance;Sit to/from stand  Functional mobility during ADLs: Moderate assistance;Rolling walker (2 wheels);Cueing for safety;Cueing for sequencing General ADL Comments: pt limited by pain, weakness, balance and dizziness.     Vision         Perception     Praxis      Pertinent Vitals/Pain Pain  Assessment Pain Assessment: Faces Faces Pain Scale: Hurts little more Pain Location: R hip, thigh Pain Descriptors / Indicators: Shooting, Grimacing, Guarding Pain Intervention(s): Limited activity within patient's tolerance, Monitored during session, Premedicated before session, Repositioned     Hand Dominance     Extremity/Trunk Assessment Upper Extremity Assessment Upper Extremity Assessment: Overall WFL for tasks assessed   Lower Extremity Assessment Lower Extremity Assessment: Defer to PT evaluation       Communication Communication Communication: No difficulties   Cognition Arousal/Alertness: Awake/alert Behavior During Therapy: WFL for tasks assessed/performed Overall Cognitive Status: Impaired/Different from baseline Area of Impairment: Safety/judgement                         Safety/Judgement: Decreased awareness of safety, Decreased awareness of deficits           General Comments  BP monitiored during session, attempting orthostatics.  Pt transferring to chair before BP assessed in standing; reports dizziness when reaching chair and BP did not take.  Bp assessed once seated and WFL.    Exercises     Shoulder Instructions      Home Living Family/patient expects to be discharged to:: Private residence Living Arrangements: Spouse/significant other Available Help at Discharge: Friend(s) Type of Home: House Home Access: Level entry     Home Layout: One level     Bathroom Shower/Tub: Occupational psychologist: Handicapped height     Home Equipment: [place] - single point;Other (comment);Shower seat;Rollator (4 wheels);Standard Walker (upright walker)   Additional Comments: Pt spouse is 95 y.o. with dementia; friend is helping out in mean time      Prior Functioning/Environment Prior Level of Function : Independent/Modified Independent             Mobility Comments: [facility], uses Rollator community distances ADLs  Comments: pt reports he does all the cooking, drives, caregiver for spouse (does not have to physically assist her)        OT Problem List: Decreased strength;Decreased activity tolerance;Impaired balance (sitting and/or standing);Decreased safety awareness;Decreased knowledge of use of DME or AE;Decreased knowledge of precautions;Pain      OT Treatment/Interventions: Self-care/ADL training;Therapeutic exercise;DME and/or AE instruction;Therapeutic activities;Balance training;Patient/family education    OT Goals(Current goals can be found in the care plan section) Acute Rehab OT Goals Patient Stated Goal: home OT Goal Formulation: With patient Time For Goal Achievement: 06/12/21 Potential to Achieve Goals: Good  OT Frequency: Min 2X/week    Co-evaluation              AM-PAC OT "6 Clicks" Daily Activity     Outcome Measure Help from another person eating meals?: None Help from another person taking care of personal grooming?: A Little Help from another person toileting, which includes using toliet, bedpan, or urinal?: Total Help from another person bathing (including washing, rinsing, drying)?: A Lot Help from another person to put on and taking off regular upper body clothing?: A Little Help from another person to put on and taking off regular lower body clothing?: A Lot 6 Click Score: 15   End of Session Equipment Utilized During Treatment: Gait belt;Rolling walker (2  wheels) Nurse Communication: Mobility status  Activity Tolerance: Patient tolerated treatment well Patient left: in chair;with call bell/phone within reach;with chair alarm set  OT Visit Diagnosis: Other abnormalities of gait and mobility (R26.89);Muscle weakness (generalized) (M62.81);History of falling (Z91.81);Pain Pain - Right/Left: Right Pain - part of body: Hip;Leg                Time: 6840-3353 OT Time Calculation (min): 27 min Charges:  OT General Charges $OT Visit: 1 Visit OT Evaluation $OT  Eval Moderate Complexity: 1 Mod OT Treatments $Self Care/Home Management : 8-22 mins  Jolaine Artist, OT Acute Rehabilitation Services Pager [phone] Office [phone]   Delight Stare 05/29/2021, 9:11 AM

## 2021-05-29 NOTE — TOC Progression Note (Addendum)
Transition of Care [facility]) - Progression Note    Patient Details  Name: Joe Byrd MRN: [id number] Date of Birth: June 30, 1930  Transition of Care [facility]) CM/SW [place], LCSW Phone Number:[phone] 05/29/2021, 1:48 PM  Clinical Narrative:     CSW spoke with pt's son Joe Byrd [phone]  in regard for a safer DC plan for pt. Joe Byrd explained that pt is primary caregiver for his mother. Joe Byrd explained that they do not have any other relatives that live near by. Joe Byrd is to follow up with brother in regards to coming up with up with a plan.  1:42: PM CSW spoke with Joe Byrd again and he let CSW know that him and his brother will provide care for their mother while pt goes to SNF. Joe Byrd is going to follow up with pt and call CSW back.  Joe Byrd spoke with pt and he stated that he wanted to speak with CSW again due to having questions about SNF.  TOC team will continue to assist with discharge planning needs.   Expected Discharge Plan: [place] Barriers to Discharge: Continued Medical Work up  Expected Discharge Plan and Services Expected Discharge Plan: [place] arrangements for the past 2 months: Single Family Home                                       Social Determinants of Health (SDOH) Interventions    Readmission Risk Interventions No flowsheet data found.

## 2021-05-30 DIAGNOSIS — R262 Difficulty in walking, not elsewhere classified: Secondary | ICD-10-CM

## 2021-05-30 LAB — GLUCOSE, CAPILLARY
Glucose-Capillary: 191 mg/dL — ABNORMAL HIGH (ref 70–99)
Glucose-Capillary: 193 mg/dL — ABNORMAL HIGH (ref 70–99)

## 2021-05-30 MED ORDER — ACETAMINOPHEN 325 MG PO TABS
650.0000 mg | ORAL_TABLET | Freq: Four times a day (QID) | ORAL | Status: AC | PRN
Start: 2021-05-30 — End: ?

## 2021-05-30 MED ORDER — PANTOPRAZOLE SODIUM 40 MG PO TBEC: 40.0000 mg | DELAYED_RELEASE_TABLET | Freq: Every day | ORAL | Status: AC

## 2021-05-30 MED ORDER — HYDRALAZINE HCL 10 MG PO TABS: 10.0000 mg | ORAL_TABLET | Freq: Four times a day (QID) | ORAL | Status: AC

## 2021-05-30 MED ORDER — OXYCODONE HCL 5 MG PO TABS
5.0000 mg | ORAL_TABLET | Freq: Four times a day (QID) | ORAL | 0 refills | Status: AC | PRN
Start: 2021-05-30 — End: ?

## 2021-05-30 MED ORDER — HYDRALAZINE HCL 10 MG PO TABS
10.0000 mg | ORAL_TABLET | Freq: Four times a day (QID) | ORAL | Status: DC
  Administered 2021-05-30 (×2): 10 mg via ORAL
  Filled 2021-05-30 (×2): qty 1

## 2021-05-30 NOTE — TOC Transition Note (Addendum)
Transition of Care [facility]) - CM/SW Discharge Note   Patient Details  Name: Art Levan MRN: [id number] Date of Birth: [birth year]  Transition of Care [facility]) CM/SW Contact:  Coralee Pesa, [place] Phone Number: 05/30/2021, 11:58 AM   Clinical Narrative:    Pt to be transported to [facility] via [facility] at 12:30. Nurse to call report to [phone]. Rm# [phone]   Final next level of care: Skilled Nursing Facility Barriers to Discharge: Barriers Resolved   Patient Goals and CMS Choice Patient states their goals for this hospitalization and ongoing recovery are:: Needs to return home CMS Medicare.gov Compare Post Acute Care list provided to:: Patient Choice offered to / list presented to : Patient  Discharge Placement              Patient [facility] at: [facility] Patient to be transferred to facility by: [place] Name of family member notified: Zenia Resides Patient and family notified of of transfer: 05/30/21  Discharge Plan and Services                                     Social Determinants of Health (SDOH) Interventions     Readmission Risk Interventions No flowsheet data found.

## 2021-05-30 NOTE — Discharge Summary (Signed)
Physician Discharge Summary  [name] SNK:[id number] DOB: [birth year] DOA: 05/27/2021  PCP: Burman Freestone, MD  Admit date: 05/27/2021 Discharge date: 05/30/2021  Admitted From: Home Disposition:  SNF   Recommendations for Outpatient Follow-up:  Patient weightbearing as tolerated with PT regarding right hip periprosthetic fracture Patient to wear TED hose, adjust antihypertensive regimen as needed Patient to follow-up with his primary orthopedic as an outpatient Dr. [name]   Discharge Condition:Stable CODE STATUS: DNR Diet recommendation: Heart Healthy   Brief/Interim Summary:      Periprosthetic hip fracture, -Apparently mechanical fall resulting in hip fracture -Orthopedic input greatly appreciated, recommendation for weightbearing as tolerated with PT, and is advised to follow-up with his hip surgeon Dr. [name] as outpatient basis. -Pain control with Tylenol, and oxycodone. -PT/OT consulted, duration for subacute rehab.   Ambulatory dysfunction -Patient with recent fall, and periprosthetic hip fracture, has seen by PT, OT, very unsteady, he will need rehab placement   DNR (do not resuscitate)- (present on admission) -Admitting physician discussed code status with the patient and he would not desire resuscitation and would prefer to die a natural death should that situation arise. -He will need a gold out of facility DNR form at the time of discharge   Essential (primary) hypertension- (present on admission) with orthostasis -atenolol was discontinued due to to bradycardia while in ER -It appears to be on both losartan and lisinopril with hydrochlorothiazide, which is not a good option for this patient with baseline CKD, frail and elderly, I will discontinue lisinopril and hydrochlorothiazide. -Resumed on amlodipine and losartan on admission, blood pressure remains elevated, so he was started on low-dose hydralazine as well which will be easier to titrate for  good blood pressure control . -As well patient was mildly orthostatic by 24 points yesterday, so TED hose were initiated .    Bradycardia- (present on admission) -Patient with an episode of bradycardia to the 30s while in the ER -could have been a vasovagal episode -No arrhythmias, pauses or heart block in ED, average heart rate in the 60s during hospital stay - Atenolol has been discontinued on discharge, avoid beta-blockers in the future.     Type 2 diabetes mellitus with renal manifestations ([place])- (present on admission) -Glycemic goal is looser given advanced age -Resume home dose metformin on discharge.   Polyneuropathy in diabetes ([place])- (present on admission) -He reports foot burning, need for medication -Resume home neurontin   Chronic kidney disease, stage 3b ([place])- (present on admission) -Advanced CKD which appears to be stable from prior (Care[facility]) -He appears to be taking both ACE and ARB as well as HCTZ -Continue to hold lisinopril and hydrochlorothiazide -back  on losartan.     Mixed hyperlipidemia- (present on admission) -Continue gemfibrozil      Discharge Diagnoses:  Principal Problem:   Periprosthetic hip fracture, initial encounter Active Problems:   Mixed hyperlipidemia   Chronic kidney disease, stage 3b ([facility])   Polyneuropathy in diabetes ([place])   Type 2 diabetes mellitus with renal manifestations ([place])   Bradycardia   Essential (primary) hypertension   DNR (do not resuscitate)   Ambulatory dysfunction    Discharge Instructions  Discharge Instructions     Discharge instructions   Complete by: As directed    Follow with Primary MD /SNF physician  Get CBC, CMP,  checked  by Primary MD next visit.    Activity: Weightbearing as tolerated right hip with PT   Disposition SNF   Diet: Heart Healthy For Heart  failure patients - Check your Weight same time everyday, if you gain over 2 pounds, or you develop in leg swelling, experience more  shortness of breath or chest pain, call your Primary MD immediately. Follow Cardiac Low Salt Diet and 1.5 lit/day fluid restriction.   On your next visit with your primary care physician please Get Medicines reviewed and adjusted.   Please request your Prim.MD to go over all Hospital Tests and Procedure/Radiological results at the follow up, please get all Hospital records sent to your Prim MD by signing hospital release before you go home.   If you experience worsening of your admission symptoms, develop shortness of breath, life threatening emergency, suicidal or homicidal thoughts you must seek medical attention immediately by calling 911 or calling your MD immediately  if symptoms less severe.  You Must read complete instructions/literature along with all the possible adverse reactions/side effects for all the Medicines you take and that have been prescribed to you. Take any new Medicines after you have completely understood and accpet all the possible adverse reactions/side effects.   Do not drive, operating heavy machinery, perform activities at heights, swimming or participation in water activities or provide baby sitting services if your were admitted for syncope or siezures until you have seen by Primary MD or a Neurologist and advised to do so again.  Do not drive when taking Pain medications.    Do not take more than prescribed Pain, Sleep and Anxiety Medications  Special Instructions: If you have smoked or chewed Tobacco  in the last 2 yrs please stop smoking, stop any regular Alcohol  and or any Recreational drug use.  Wear Seat belts while driving.   Please note  You were cared for by a hospitalist during your hospital stay. If you have any questions about your discharge medications or the care you received while you were in the hospital after you are discharged, you can call the unit and asked to speak with the hospitalist on call if the hospitalist that took care of you is  not available. Once you are discharged, your primary care physician will handle any further medical issues. Please note that NO REFILLS for any discharge medications will be authorized once you are discharged, as it is imperative that you return to your primary care physician (or establish a relationship with a primary care physician if you do not have one) for your aftercare needs so that they can reassess your need for medications and monitor your lab values.   Increase activity slowly   Complete by: As directed    No wound care   Complete by: As directed       Allergies as of 05/30/2021       Reactions   Atorvastatin Other (See Comments)   Elevated CK        Medication List     STOP taking these medications    atenolol 50 MG tablet Commonly known as: TENORMIN   hydrochlorothiazide 25 MG tablet Commonly known as: HYDRODIURIL   lisinopril-hydrochlorothiazide 20-25 MG tablet Commonly known as: ZESTORETIC   omeprazole 40 MG capsule Commonly known as: PRILOSEC Replaced by: pantoprazole 40 MG tablet       TAKE these medications    acetaminophen 325 MG tablet Commonly known as: TYLENOL Take 2 tablets (650 mg total) by mouth every 6 (six) hours as needed for mild pain (or Fever >/= 101).   allopurinol 300 MG tablet Commonly known as: ZYLOPRIM Take 150 mg by mouth daily.  amLODipine 10 MG tablet Commonly known as: NORVASC Take 10 mg by mouth daily.   aspirin 81 MG EC tablet Take 1 tablet by mouth daily.   Cholecalciferol 25 MCG (1000 UT) tablet Take 1,000 Units by mouth daily.   ferrous sulfate 325 (65 FE) MG tablet Take 325 mg by mouth daily with breakfast.   fluorouracil 5 % cream Commonly known as: EFUDEX Apply 1 application topically 2 (two) times daily as needed (rash, flares).   gabapentin 300 MG capsule Commonly known as: NEURONTIN Take 300 mg by mouth 3 (three) times daily.   gemfibrozil 600 MG tablet Commonly known as: LOPID Take 600 mg by  mouth 2 (two) times daily.   hydrALAZINE 10 MG tablet Commonly known as: APRESOLINE Take 1 tablet (10 mg total) by mouth every 6 (six) hours.   losartan 25 MG tablet Commonly known as: COZAAR Take 25 mg by mouth daily.   metFORMIN 1000 MG tablet Commonly known as: GLUCOPHAGE Take 1,000 mg by mouth 2 (two) times daily.   oxyCODONE 5 MG immediate release tablet Commonly known as: Oxy IR/ROXICODONE Take 1 tablet (5 mg total) by mouth every 6 (six) hours as needed for severe pain or breakthrough pain.   pantoprazole 40 MG tablet Commonly known as: PROTONIX Take 1 tablet (40 mg total) by mouth daily. Start taking on: May 31, 2021 Replaces: omeprazole 40 MG capsule   terazosin 5 MG capsule Commonly known as: HYTRIN Take 5 mg by mouth at bedtime.        Contact information for after-discharge care     Destination     [facility] .   Service: Skilled Nursing Contact information: [address] [phone]                    Allergies  Allergen Reactions   Atorvastatin Other (See Comments)    Elevated CK      Consultations: none   Procedures/Studies: CT Head Wo Contrast  Result Date: 05/27/2021 CLINICAL DATA:  Head trauma, minor (Age >= 65y). Left forehead laceration EXAM: CT HEAD WITHOUT CONTRAST TECHNIQUE: Contiguous axial images were obtained from the base of the skull through the vertex without intravenous contrast. RADIATION DOSE REDUCTION: This exam was performed according to the departmental dose-optimization program which includes automated exposure control, adjustment of the mA and/or kV according to patient size and/or use of iterative reconstruction technique. COMPARISON:  07/06/2020 FINDINGS: Brain: No evidence of acute infarction, hemorrhage, hydrocephalus, extra-axial collection or mass lesion/mass effect. Scattered low-density changes within the periventricular and subcortical white matter  compatible with chronic microvascular ischemic change. Mild diffuse cerebral volume loss. Vascular: Atherosclerotic calcifications involving the large vessels of the skull base. No unexpected hyperdense vessel. Skull: Normal. Negative for fracture or focal lesion. Sinuses/Orbits: Partial right mastoid effusion. The visualized paranasal sinuses are clear. Other: Mild soft tissue swelling in the right supraorbital region. No scalp hematoma. No radiopaque foreign body. IMPRESSION: 1. No acute intracranial abnormality. 2. Mild soft tissue swelling in the right supraorbital region. No scalp hematoma. No radiopaque foreign body. 3. Partial right mastoid effusion. Electronically Signed   By: Davina Poke D.O.   On: 05/27/2021 14:13   CT Lumbar Spine Wo Contrast  Result Date: 05/27/2021 CLINICAL DATA:  Back trauma, fall EXAM: CT LUMBAR SPINE WITHOUT CONTRAST TECHNIQUE: Multidetector CT imaging of the lumbar spine was performed without intravenous contrast administration. Multiplanar CT image reconstructions were also generated. RADIATION DOSE REDUCTION: This exam  was performed according to the departmental dose-optimization program which includes automated exposure control, adjustment of the mA and/or kV according to patient size and/or use of iterative reconstruction technique. COMPARISON:  None. FINDINGS: Segmentation: 5 lumbar type vertebrae. Alignment: Normal. Vertebrae: Posterior lumbar interbody fusion at L3-L5. Disc spacers at L3-L4 and L4-L5. No acute fracture or focal pathologic process. Paraspinal and other soft tissues: Negative. Disc levels: T12-L1: No significant finding L1-L2: Significant findings L2-L3: Disc osteophyte complex with narrowing of spinal canal and lateral recess narrowing. No neural foraminal narrowing. L3-L4: Posterior spinal fusion and laminectomy changes. No spinal canal or neural foraminal narrowing. L4-L5: Postsurgical changes. Facet joint arthropathy and osteophytes. Mild right  neural foraminal narrowing. L5-S1: Laminectomy changes. Bilateral facet joint arthropathy right greater than the left. Mild right neural foraminal narrowing. IMPRESSION: 1. Posterior spinal fusion at L3-L5. No evidence of acute fracture or subluxation. 2. Disc osteophyte complex with bilateral lateral recess narrowing at L2-L3. 3. Facet joint arthropathy with mild right neural foraminal narrowing at L5-S1. Electronically Signed   By: Keane Police D.O.   On: 05/27/2021 17:15   CT PELVIS WO CONTRAST  Result Date: 05/27/2021 CLINICAL DATA:  Right hip pain after mechanical fall EXAM: CT PELVIS WITHOUT CONTRAST TECHNIQUE: Multidetector CT imaging of the pelvis was performed following the standard protocol without intravenous contrast. RADIATION DOSE REDUCTION: This exam was performed according to the departmental dose-optimization program which includes automated exposure control, adjustment of the mA and/or kV according to patient size and/or use of iterative reconstruction technique. COMPARISON:  X-ray 05/27/2021, CT 07/06/2020 FINDINGS: Urinary Tract:  No abnormality visualized. Bowel:  Unremarkable visualized pelvic bowel loops. Vascular/Lymphatic: Aortoiliac atherosclerosis without aneurysm. No intrapelvic or inguinal lymphadenopathy. Reproductive:  Prostate gland obscured by metallic streak artifact. Other:  No ascites within the pelvis. Musculoskeletal: Status post right total hip arthroplasty. Acute nondisplaced periprosthetic fracture involving the intertrochanteric aspect of the proximal right femur (series 6, images 31-45). There is fullness within the proximal anterior compartment musculature of the right thigh near the fracture site likely a component of posttraumatic hematoma. No evidence of hardware fracture or dislocation. No periprosthetic lucency. Pelvic bony ring intact without fracture or diastasis. Mild degenerative changes involving the left hip, sacroiliac joints, and pubic symphysis. Mild soft  tissue edema about the right hip. No organized fluid collection or hematoma within the soft tissues. IMPRESSION: 1. Acute nondisplaced periprosthetic fracture involving the intertrochanteric aspect of the proximal right femur. 2. Fullness within the proximal anterior compartment musculature of the right thigh near the fracture site, likely a component of posttraumatic hematoma. Aortic Atherosclerosis (ICD10-I70.0). Electronically Signed   By: Davina Poke D.O.   On: 05/27/2021 16:58   DG Chest Port 1 View  Result Date: 05/27/2021 CLINICAL DATA:  Hip pain post fall EXAM: PORTABLE CHEST 1 VIEW COMPARISON:  07/06/2020 FINDINGS: Enlargement of cardiac silhouette. Prominent RIGHT paratracheal soft tissues unchanged question related to vascular prominence. Pulmonary vascularity normal. Atherosclerotic calcification aorta aortic arch. Minimal bibasilar atelectasis. Lungs otherwise clear. No acute infiltrate, pleural effusion, or pneumothorax. IMPRESSION: Minimal bibasilar atelectasis. Mild enlargement of cardiac silhouette. Electronically Signed   By: Lavonia Dana M.D.   On: 05/27/2021 14:33   DG Hip Unilat W or Wo Pelvis 2-3 Views Right  Result Date: 05/27/2021 CLINICAL DATA:  Hip pain post fall EXAM: DG HIP (WITH OR WITHOUT PELVIS) 2-3V RIGHT COMPARISON:  Pelvic radiograph 07/11/2017 FINDINGS: Interval RIGHT total hip arthroplasty. Osseous mineralization diminished. Slight irregularity at the lateral margin of the junction of  the greater trochanter and femoral metaphysis similar to prior CT exam with overlying small rounded ossicle. No definite acute fracture, dislocation, or bone destruction. IMPRESSION: RIGHT hip prosthesis. No definite acute fracture or dislocation. Electronically Signed   By: Lavonia Dana M.D.   On: 05/27/2021 14:38      Subjective: Denies any chest pain, shortness of breath, reports weakness and fatigue  Discharge Exam: Vitals:   05/30/21 0800 05/30/21 1100  BP: (!) 145/59 123/61   Pulse: 69 81  Resp:    Temp: 98.3 F (36.8 C)   SpO2: 93% 94%   Vitals:   05/29/21 2300 05/30/21 0400 05/30/21 0800 05/30/21 1100  BP: (!) 162/65 (!) 163/56 (!) 145/59 123/61  Pulse: 73 71 69 81  Resp: 16     Temp: 98.1 F (36.7 C) 98.4 F (36.9 C) 98.3 F (36.8 C)   TempSrc: Oral Oral Oral   SpO2: 94% 93% 93% 94%  Weight:      Height:        General: Pt is alert, awake, not in acute distress, right periorbital bruising Cardiovascular: RRR, S1/S2 +, no rubs, no gallops Respiratory: CTA bilaterally, no wheezing, no rhonchi Abdominal: Soft, NT, ND, bowel sounds + Extremities: no edema, no cyanosis    The results of significant diagnostics from this hospitalization (including imaging, microbiology, ancillary and laboratory) are listed below for reference.     Microbiology: Recent Results (from the past 240 hour(s))  Resp Panel by RT-PCR (Flu A&B, Covid) Nasopharyngeal Swab     Status: None   Collection Time: 05/27/21  7:21 PM   Specimen: Nasopharyngeal Swab; Nasopharyngeal(NP) swabs in vial transport medium  Result Value Ref Range Status   SARS Coronavirus 2 by RT PCR NEGATIVE NEGATIVE Final    Comment: (NOTE) SARS-CoV-2 target nucleic acids are NOT DETECTED.  The SARS-CoV-2 RNA is generally detectable in upper respiratory specimens during the acute phase of infection. The lowest concentration of SARS-CoV-2 viral copies this assay can detect is 138 copies/mL. A negative result does not preclude SARS-Cov-2 infection and should not be used as the sole basis for treatment or other patient management decisions. A negative result may occur with  improper specimen collection/handling, submission of specimen other than nasopharyngeal swab, presence of viral mutation(s) within the areas targeted by this assay, and inadequate number of viral copies(<138 copies/mL). A negative result must be combined with clinical observations, patient history, and  epidemiological information. The expected result is Negative.  Fact Sheet for Patients:  EntrepreneurPulse.com.au  Fact Sheet for Healthcare Providers:  IncredibleEmployment.be  This test is no t yet approved or cleared by the [place] FDA and  has been authorized for detection and/or diagnosis of SARS-CoV-2 by FDA under an Emergency Use Authorization (EUA). This EUA will remain  in effect (meaning this test can be used) for the duration of the COVID-19 declaration under Section 564(b)(1) of the Act, 21 U.S.C.section 360bbb-3(b)(1), unless the authorization is terminated  or revoked sooner.       Influenza A by PCR NEGATIVE NEGATIVE Final   Influenza B by PCR NEGATIVE NEGATIVE Final    Comment: (NOTE) The Xpert Xpress SARS-CoV-2/FLU/RSV plus assay is intended as an aid in the diagnosis of influenza from Nasopharyngeal swab specimens and should not be used as a sole basis for treatment. Nasal washings and aspirates are unacceptable for Xpert Xpress SARS-CoV-2/FLU/RSV testing.  Fact Sheet for Patients: EntrepreneurPulse.com.au  Fact Sheet for Healthcare Providers: IncredibleEmployment.be  This test is not yet approved or  cleared by the [place] and has been authorized for detection and/or diagnosis of SARS-CoV-2 by FDA under an Emergency Use Authorization (EUA). This EUA will remain in effect (meaning this test can be used) for the duration of the COVID-19 declaration under Section 564(b)(1) of the Act, 21 U.S.C. section 360bbb-3(b)(1), unless the authorization is terminated or revoked.  Performed at [facility], [address] [place], [place] [address]      Labs: BNP (last 3 results) No results for input(s): BNP in the last 8760 hours. Basic Metabolic Panel: Recent Labs  Lab 05/27/21 1452 05/29/21 0058  NA 136 137  K 3.9 3.5  CL 101 103  CO2 23 24  GLUCOSE 134*  178*  BUN 36* 30*  CREATININE 1.82* 1.84*  CALCIUM 9.1 8.9   Liver Function Tests: No results for input(s): AST, ALT, ALKPHOS, BILITOT, PROT, ALBUMIN in the last 168 hours. No results for input(s): LIPASE, AMYLASE in the last 168 hours. No results for input(s): AMMONIA in the last 168 hours. CBC: Recent Labs  Lab 05/27/21 1452 05/29/21 0058  WBC 9.9 6.2  NEUTROABS 7.8*  --   HGB 11.5* 8.7*  HCT 32.8* 25.5*  MCV 89.1 90.1  PLT 169 133*   Cardiac Enzymes: No results for input(s): CKTOTAL, CKMB, CKMBINDEX, TROPONINI in the last 168 hours. BNP: Invalid input(s): POCBNP CBG: Recent Labs  Lab 05/29/21 0608 05/29/21 1138 05/29/21 1640 05/29/21 2124 05/30/21 0643  GLUCAP 147* 171* 161* 202* 191*   D-Dimer No results for input(s): DDIMER in the last 72 hours. Hgb A1c No results for input(s): HGBA1C in the last 72 hours. Lipid Profile No results for input(s): CHOL, HDL, LDLCALC, TRIG, CHOLHDL, LDLDIRECT in the last 72 hours. Thyroid function studies No results for input(s): TSH, T4TOTAL, T3FREE, THYROIDAB in the last 72 hours.  Invalid input(s): FREET3 Anemia work up No results for input(s): VITAMINB12, FOLATE, FERRITIN, TIBC, IRON, RETICCTPCT in the last 72 hours. Urinalysis No results found for: COLORURINE, APPEARANCEUR, [place], [place], GLUCOSEU, [place], [place], [place], PROTEINUR, UROBILINOGEN, NITRITE, LEUKOCYTESUR Sepsis Labs Invalid input(s): PROCALCITONIN,  WBC,  LACTICIDVEN Microbiology Recent Results (from the past 240 hour(s))  Resp Panel by RT-PCR (Flu A&B, Covid) Nasopharyngeal Swab     Status: None   Collection Time: 05/27/21  7:21 PM   Specimen: Nasopharyngeal Swab; Nasopharyngeal(NP) swabs in vial transport medium  Result Value Ref Range Status   SARS Coronavirus 2 by RT PCR NEGATIVE NEGATIVE Final    Comment: (NOTE) SARS-CoV-2 target nucleic acids are NOT DETECTED.  The SARS-CoV-2 RNA is generally detectable in upper respiratory specimens  during the acute phase of infection. The lowest concentration of SARS-CoV-2 viral copies this assay can detect is 138 copies/mL. A negative result does not preclude SARS-Cov-2 infection and should not be used as the sole basis for treatment or other patient management decisions. A negative result may occur with  improper specimen collection/handling, submission of specimen other than nasopharyngeal swab, presence of viral mutation(s) within the areas targeted by this assay, and inadequate number of viral copies(<138 copies/mL). A negative result must be combined with clinical observations, patient history, and epidemiological information. The expected result is Negative.  Fact Sheet for Patients:  EntrepreneurPulse.com.au  Fact Sheet for Healthcare Providers:  IncredibleEmployment.be  This test is no t yet approved or cleared by the [place] FDA and  has been authorized for detection and/or diagnosis of SARS-CoV-2 by FDA under an Emergency Use Authorization (EUA). This EUA will remain  in effect (meaning this test can be used) for the duration of the COVID-19 declaration under Section 564(b)(1) of the Act, 21 U.S.C.section 360bbb-3(b)(1), unless the authorization is terminated  or revoked sooner.       Influenza A by PCR NEGATIVE NEGATIVE Final   Influenza B by PCR NEGATIVE NEGATIVE Final    Comment: (NOTE) The Xpert Xpress SARS-CoV-2/FLU/RSV plus assay is intended as an aid in the diagnosis of influenza from Nasopharyngeal swab specimens and should not be used as a sole basis for treatment. Nasal washings and aspirates are unacceptable for Xpert Xpress SARS-CoV-2/FLU/RSV testing.  Fact Sheet for Patients: EntrepreneurPulse.com.au  Fact Sheet for Healthcare Providers: IncredibleEmployment.be  This test is not yet approved or cleared by the [place] FDA and has been authorized for detection  and/or diagnosis of SARS-CoV-2 by FDA under an Emergency Use Authorization (EUA). This EUA will remain in effect (meaning this test can be used) for the duration of the COVID-19 declaration under Section 564(b)(1) of the Act, 21 U.S.C. section 360bbb-3(b)(1), unless the authorization is terminated or revoked.  Performed at [facility], [address] [place], [place] [address]      Time coordinating discharge: Over 30 minutes  SIGNED:   Phillips Climes, MD  [facility] 05/30/2021, 11:36 AM Pager   If 7PM-7AM, please contact night-coverage [email or web address] Password [email or web address]

## 2021-05-30 NOTE — Progress Notes (Signed)
Physical Therapy Treatment Patient Details Name: Joe Byrd MRN: [id number] DOB: 1930/07/13 Today's Date: 05/30/2021   History of Present Illness Pt is a 88 y.o. M who presents 05/27/2021 with a fall and right periprosthetic hip fracture. Plan is for WBAT without surgical intervention. Significant PMH: DM, stage 3b CKD, HTN.    PT Comments    Pt making steady progress towards his physical therapy goals, remains motivated to participate. Session focused on bed level exercises for strengthening and transfer/gait training. Pt ambulating x 15 ft with min assist and close chair follow. Utilizes step to pattern with decreased bilateral foot clearance. Continue to recommend SNF for ongoing Physical Therapy.      Recommendations for follow up therapy are one component of a multi-disciplinary discharge planning process, led by the attending physician.  Recommendations may be updated based on patient status, additional functional criteria and insurance authorization.  Follow Up Recommendations  Skilled nursing-short term rehab (<3 hours/day)     Assistance Recommended at Discharge Frequent or constant Supervision/Assistance  Patient can return home with the following A lot of help with walking and/or transfers;A lot of help with bathing/dressing/bathroom;Assist for transportation   Equipment Recommendations  Other (comment) (defer)    Recommendations for Other Services       Precautions / Restrictions Precautions Precautions: Fall Precaution Comments: monitor BP Restrictions Weight Bearing Restrictions: Yes RLE Weight Bearing: Weight bearing as tolerated     Mobility  Bed Mobility Overal bed mobility: Needs Assistance Bed Mobility: Supine to Sit     Supine to sit: Mod assist     General bed mobility comments: Assist for RLE to edge of bed, trunk to upright    Transfers Overall transfer level: Needs assistance Equipment used: Rolling walker (2 wheels) Transfers: Sit to/from  Stand Sit to Stand: Min assist           General transfer comment: MinA to rise from edge of bed and walker while PT stabilized walker, increased time/effort    Ambulation/Gait Ambulation/Gait assistance: Min assist Gait Distance (Feet): 15 Feet Assistive device: Rolling walker (2 wheels) Gait Pattern/deviations: Step-to pattern, Decreased stride length, Decreased dorsiflexion - right, Decreased weight shift to right, Antalgic, Trunk flexed Gait velocity: decreased Gait velocity interpretation: <1.8 ft/sec, indicate of risk for recurrent falls   General Gait Details: Cues for sequencing/technique. Pt with step to pattern, decreased bilateral foot clearance and difficulty with weight shifting. Close chair follow utilized   Marine scientist Rankin (Stroke Patients Only)       Balance Overall balance assessment: Needs assistance Sitting-balance support: No upper extremity supported, Feet supported Sitting balance-Leahy Scale: Fair     Standing balance support: Bilateral upper extremity supported, During functional activity Standing balance-Leahy Scale: Poor Standing balance comment: relies on BUE and external support                            Cognition Arousal/Alertness: Awake/alert Behavior During Therapy: WFL for tasks assessed/performed Overall Cognitive Status: Within Functional Limits for tasks assessed                                          Exercises General Exercises - Lower Extremity Quad Sets: Right, 15 reps, Supine Long Arc Quad: Both, 10 reps, Seated, AAROM,  AROM Heel Slides: AROM, AAROM, Both, 10 reps, Supine Hip ABduction/ADduction: AROM, Both, 10 reps, Supine Straight Leg Raises: Left, 10 reps, Supine    General Comments General comments (skin integrity, edema, etc.): VSS on RA      Pertinent Vitals/Pain Pain Assessment Pain Assessment: Faces Faces Pain Scale: Hurts little  more Pain Location: R hip, thigh Pain Descriptors / Indicators: Shooting, Grimacing, Guarding Pain Intervention(s): Monitored during session, Premedicated before session    Home Living                          Prior Function            PT Goals (current goals can now be found in the care plan section) Acute Rehab PT Goals Patient Stated Goal: go home Potential to Achieve Goals: Good Progress towards PT goals: Progressing toward goals    Frequency    Min 3X/week      PT Plan Frequency needs to be updated    Co-evaluation              AM-PAC PT "6 Clicks" Mobility   Outcome Measure  Help needed turning from your back to your side while in a flat bed without using bedrails?: A Little Help needed moving from lying on your back to sitting on the side of a flat bed without using bedrails?: A Lot Help needed moving to and from a bed to a chair (including a wheelchair)?: A Little Help needed standing up from a chair using your arms (e.g., wheelchair or bedside chair)?: A Little Help needed to walk in hospital room?: A Little Help needed climbing 3-5 steps with a railing? : Total 6 Click Score: 15    End of Session Equipment Utilized During Treatment: Gait belt Activity Tolerance: Patient tolerated treatment well Patient left: in chair;with call bell/phone within reach;with chair alarm set Nurse Communication: Mobility status PT Visit Diagnosis: Unsteadiness on feet (R26.81);Muscle weakness (generalized) (M62.81);Difficulty in walking, not elsewhere classified (R26.2);Pain;Other abnormalities of gait and mobility (R26.89) Pain - Right/Left: Right Pain - part of body: Hip     Time: 8182-9937 PT Time Calculation (min) (ACUTE ONLY): 26 min  Charges:  $Gait Training: 8-22 mins $Therapeutic Exercise: 8-22 mins                     Wyona Almas, PT, DPT Acute Rehabilitation Services Pager [phone] Office [phone]    Deno Etienne 05/30/2021, 1:03 PM

## 2021-05-30 NOTE — TOC CAGE-AID Note (Signed)
Transition of Care [facility]) - CAGE-AID Screening   Patient Details  Name: Joe Byrd MRN: [id number] Date of Birth: [birth year]  [facility] of [facility]) CM/SW Contact:    Isebella Upshur C Tarpley-Carter, [employer] Phone Number: 05/30/2021, 9:36 AM   Clinical Narrative: Pt participated in [place].  Pt stated he does not use substance or ETOH.  Pt was not offered resources, due to no usage of substance or ETOH.    Emya Picado Tarpley-Carter, MSW, LCSW-A Pronouns:  She/Her/Hers [place] Transitions of Care Clinical Social Worker Direct Number:  [phone] [email or web address]     CAGE-AID Screening:    Have You Ever Felt You Ought to Cut Down on Your Drinking or Drug Use?: No Have People Annoyed You By [employer] Your Drinking Or Drug Use?: No Have You Felt Bad Or Guilty About Your Drinking Or Drug Use?: No      Substance Abuse Education Offered: No

## 2021-05-30 NOTE — Progress Notes (Signed)
RN placed AVS summary in packet for PTAR, as well as DNR form and paper Rx. NT removing PIV and placing TED hose on pt. PTAR transporting pt to SNF.RN called report to SNF [facility]) to [place], [place].

## 2021-05-30 NOTE — Discharge Instructions (Signed)
Follow with Primary MD /SNF physician  Get CBC, CMP,  checked  by Primary MD next visit.    Activity: Weightbearing as tolerated right hip with PT   Disposition SNF   Diet: Heart Healthy For Heart failure patients - Check your Weight same time everyday, if you gain over 2 pounds, or you develop in leg swelling, experience more shortness of breath or chest pain, call your Primary MD immediately. Follow Cardiac Low Salt Diet and 1.5 lit/day fluid restriction.   On your next visit with your primary care physician please Get Medicines reviewed and adjusted.   Please request your Prim.MD to go over all Hospital Tests and Procedure/Radiological results at the follow up, please get all Hospital records sent to your Prim MD by signing hospital release before you go home.   If you experience worsening of your admission symptoms, develop shortness of breath, life threatening emergency, suicidal or homicidal thoughts you must seek medical attention immediately by calling 911 or calling your MD immediately  if symptoms less severe.  You Must read complete instructions/literature along with all the possible adverse reactions/side effects for all the Medicines you take and that have been prescribed to you. Take any new Medicines after you have completely understood and accpet all the possible adverse reactions/side effects.   Do not drive, operating heavy machinery, perform activities at heights, swimming or participation in water activities or provide baby sitting services if your were admitted for syncope or siezures until you have seen by Primary MD or a Neurologist and advised to do so again.  Do not drive when taking Pain medications.    Do not take more than prescribed Pain, Sleep and Anxiety Medications  Special Instructions: If you have smoked or chewed Tobacco  in the last 2 yrs please stop smoking, stop any regular Alcohol  and or any Recreational drug use.  Wear Seat belts while  driving.   Please note  You were cared for by a hospitalist during your hospital stay. If you have any questions about your discharge medications or the care you received while you were in the hospital after you are discharged, you can call the unit and asked to speak with the hospitalist on call if the hospitalist that took care of you is not available. Once you are discharged, your primary care physician will handle any further medical issues. Please note that NO REFILLS for any discharge medications will be authorized once you are discharged, as it is imperative that you return to your primary care physician (or establish a relationship with a primary care physician if you do not have one) for your aftercare needs so that they can reassess your need for medications and monitor your lab values.

## 2021-05-30 NOTE — Progress Notes (Signed)
Occupational Therapy Treatment Patient Details Name: Joe Byrd MRN: [id number] DOB: 07-28-1930 Today's Date: 05/30/2021   History of present illness Pt is a 88 y.o. M who presents 05/27/2021 with a fall and right periprosthetic hip fracture. Plan is for WBAT without surgical intervention. Significant PMH: DM, stage 3b CKD, HTN.   OT comments  Patient progressing, agreeable to SNF now.  Continues to requires mod assist for transfers, limited mobility in room due to fatiguing easily.  Mod assist for LB dressing.  Completed chair push ups x 10 for UE strengthening for transfers and mobility. Will follow acutely.    Recommendations for follow up therapy are one component of a multi-disciplinary discharge planning process, led by the attending physician.  Recommendations may be updated based on patient status, additional functional criteria and insurance authorization.    Follow Up Recommendations  Skilled nursing-short term rehab (<3 hours/day)    Assistance Recommended at Discharge Frequent or constant Supervision/Assistance  Patient can return home with the following  A lot of help with walking and/or transfers;A lot of help with bathing/dressing/bathroom   Equipment Recommendations  None recommended by OT    Recommendations for Other Services      Precautions / Restrictions Precautions Precautions: Fall Precaution Comments: monitor BP Restrictions Weight Bearing Restrictions: Yes RLE Weight Bearing: Weight bearing as tolerated       Mobility Bed Mobility               General bed mobility comments: OOB upon entry    Transfers                         Balance Overall balance assessment: Needs assistance Sitting-balance support: No upper extremity supported, Feet supported Sitting balance-Leahy Scale: Fair     Standing balance support: Bilateral upper extremity supported, During functional activity Standing balance-Leahy Scale: Poor Standing balance  comment: relies on BUE and external support                           ADL either performed or assessed with clinical judgement   ADL Overall ADL's : Needs assistance/impaired     Grooming: Set up;Sitting               Lower Body Dressing: Moderate assistance;Sit to/from stand Lower Body Dressing Details (indicate cue type and reason): assist for R sock, mod assist sit to stand Toilet Transfer: Moderate assistance;Ambulation;Rolling walker (2 wheels) Toilet Transfer Details (indicate cue type and reason): simulated in room         Functional mobility during ADLs: Minimal assistance;Rolling walker (2 wheels);Cueing for safety;Cueing for sequencing General ADL Comments: pt ambulated in room approx 6 feet before fatiguing.    Extremity/Trunk Assessment              Vision       Perception     Praxis      Cognition Arousal/Alertness: Awake/alert Behavior During Therapy: WFL for tasks assessed/performed Overall Cognitive Status: Within Functional Limits for tasks assessed                                          Exercises Exercises: Other exercises Other Exercises Other Exercises: chair pushups x 10 reps    Shoulder Instructions       General Comments VSS on RA    Pertinent  Vitals/ Pain       Pain Assessment Pain Assessment: Faces Faces Pain Scale: Hurts little more Pain Location: R hip, thigh Pain Descriptors / Indicators: Shooting, Grimacing, Guarding Pain Intervention(s): Limited activity within patient's tolerance, Monitored during session, Repositioned  Home Living                                          Prior Functioning/Environment              Frequency  Min 2X/week        Progress Toward Goals  OT Goals(current goals can now be found in the care plan section)  Progress towards OT goals: Progressing toward goals  Acute Rehab OT Goals Patient Stated Goal: get to rehab OT Goal  Formulation: With patient  Plan Discharge plan remains appropriate;Frequency remains appropriate    Co-evaluation                 AM-PAC OT "6 Clicks" Daily Activity     Outcome Measure   Help from another person eating meals?: None Help from another person taking care of personal grooming?: A Little Help from another person toileting, which includes using toliet, bedpan, or urinal?: A Lot Help from another person bathing (including washing, rinsing, drying)?: A Lot Help from another person to put on and taking off regular upper body clothing?: A Little Help from another person to put on and taking off regular lower body clothing?: A Lot 6 Click Score: 16    End of Session Equipment Utilized During Treatment: Gait belt;Rolling walker (2 wheels)  OT Visit Diagnosis: Other abnormalities of gait and mobility (R26.89);Muscle weakness (generalized) (M62.81);History of falling (Z91.81);Pain Pain - Right/Left: Right Pain - part of body: Hip;Leg   Activity Tolerance Patient tolerated treatment well   Patient Left in chair;with call bell/phone within reach;with chair alarm set   Nurse Communication Mobility status        Time: 1209-1226 OT Time Calculation (min): 17 min  Charges: OT General Charges $OT Visit: 1 Visit OT Treatments $Self Care/Home Management : 8-22 mins  Jolaine Artist, [place] Pager [phone] Office [phone]   Delight Stare 05/30/2021, 12:40 PM

## 2023-03-09 IMAGING — DX DG HIP (WITH OR WITHOUT PELVIS) 2-3V*R*
3 series · 3 of 3 positions shown · non-contrast
Comparison: Pelvic radiograph 07/11/2017

CLINICAL DATA: Hip pain post fall

EXAM:
DG HIP (WITH OR WITHOUT PELVIS) 2-3V RIGHT

[pelvis ap]
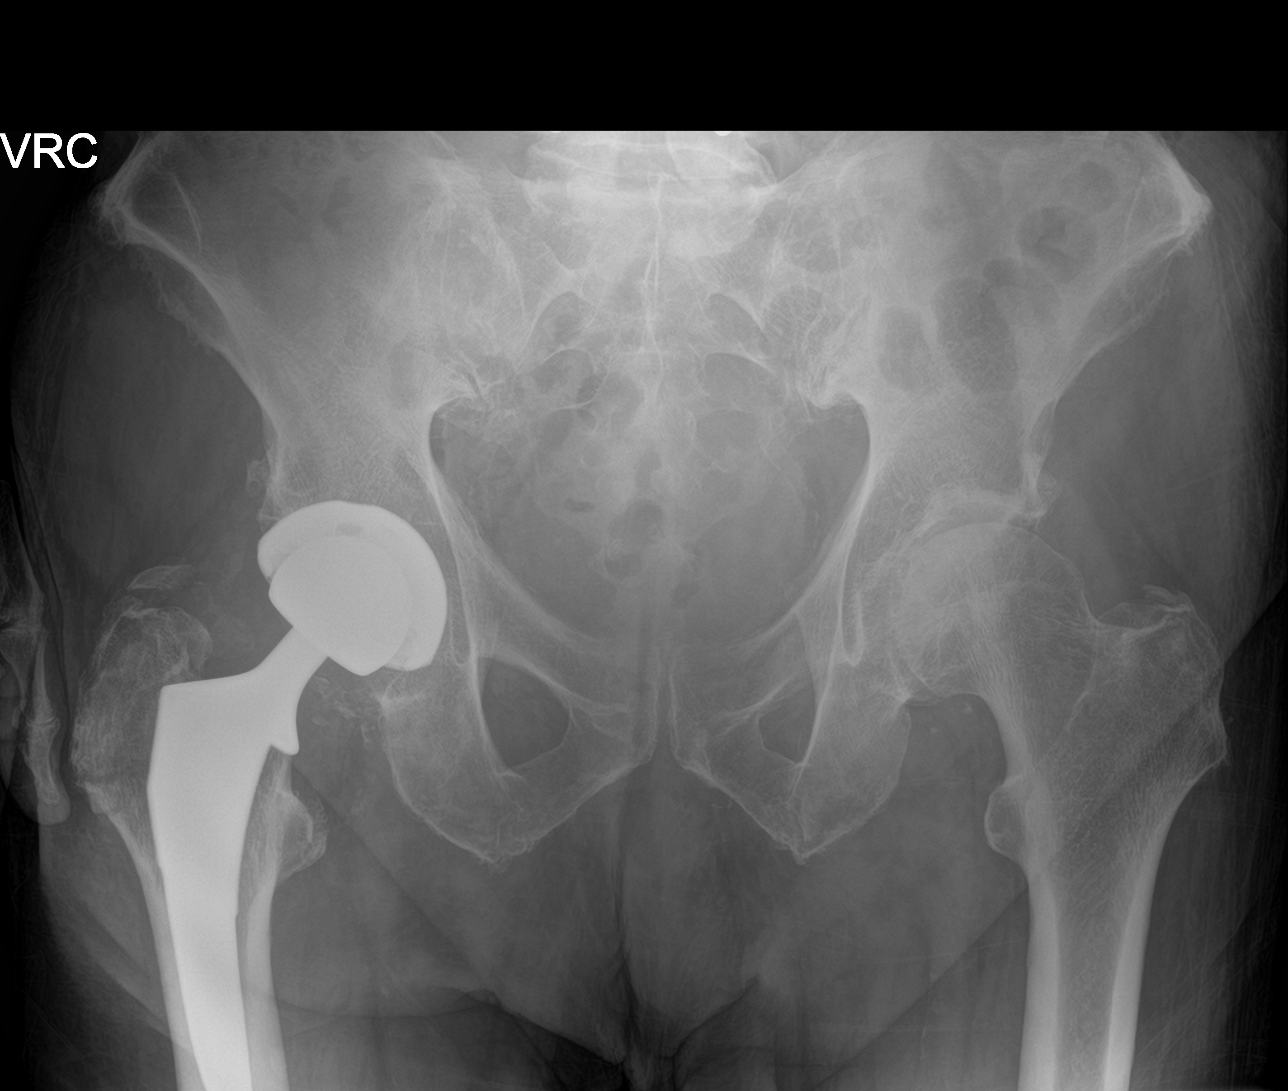

[hip ap]
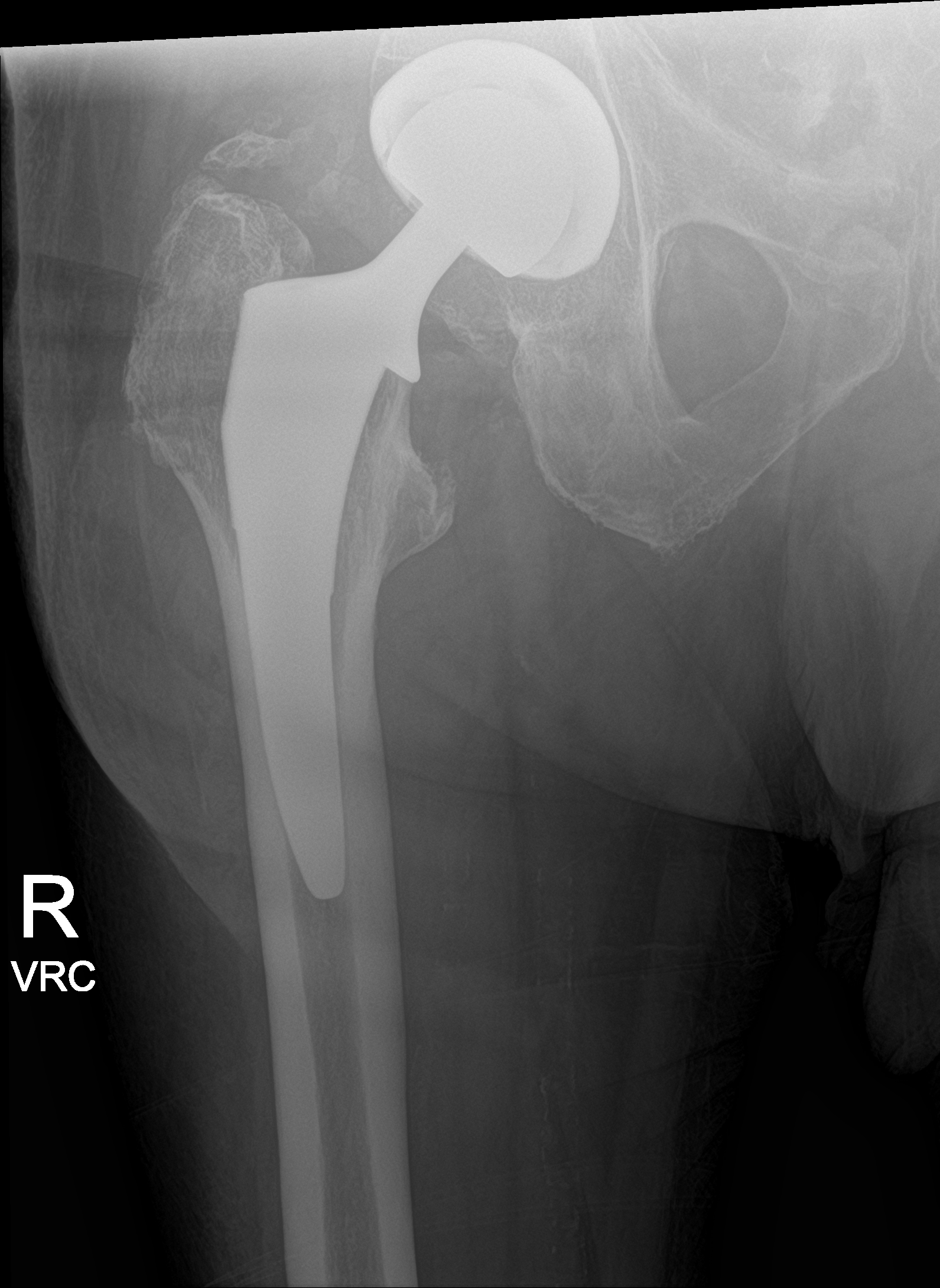

[hip lat]
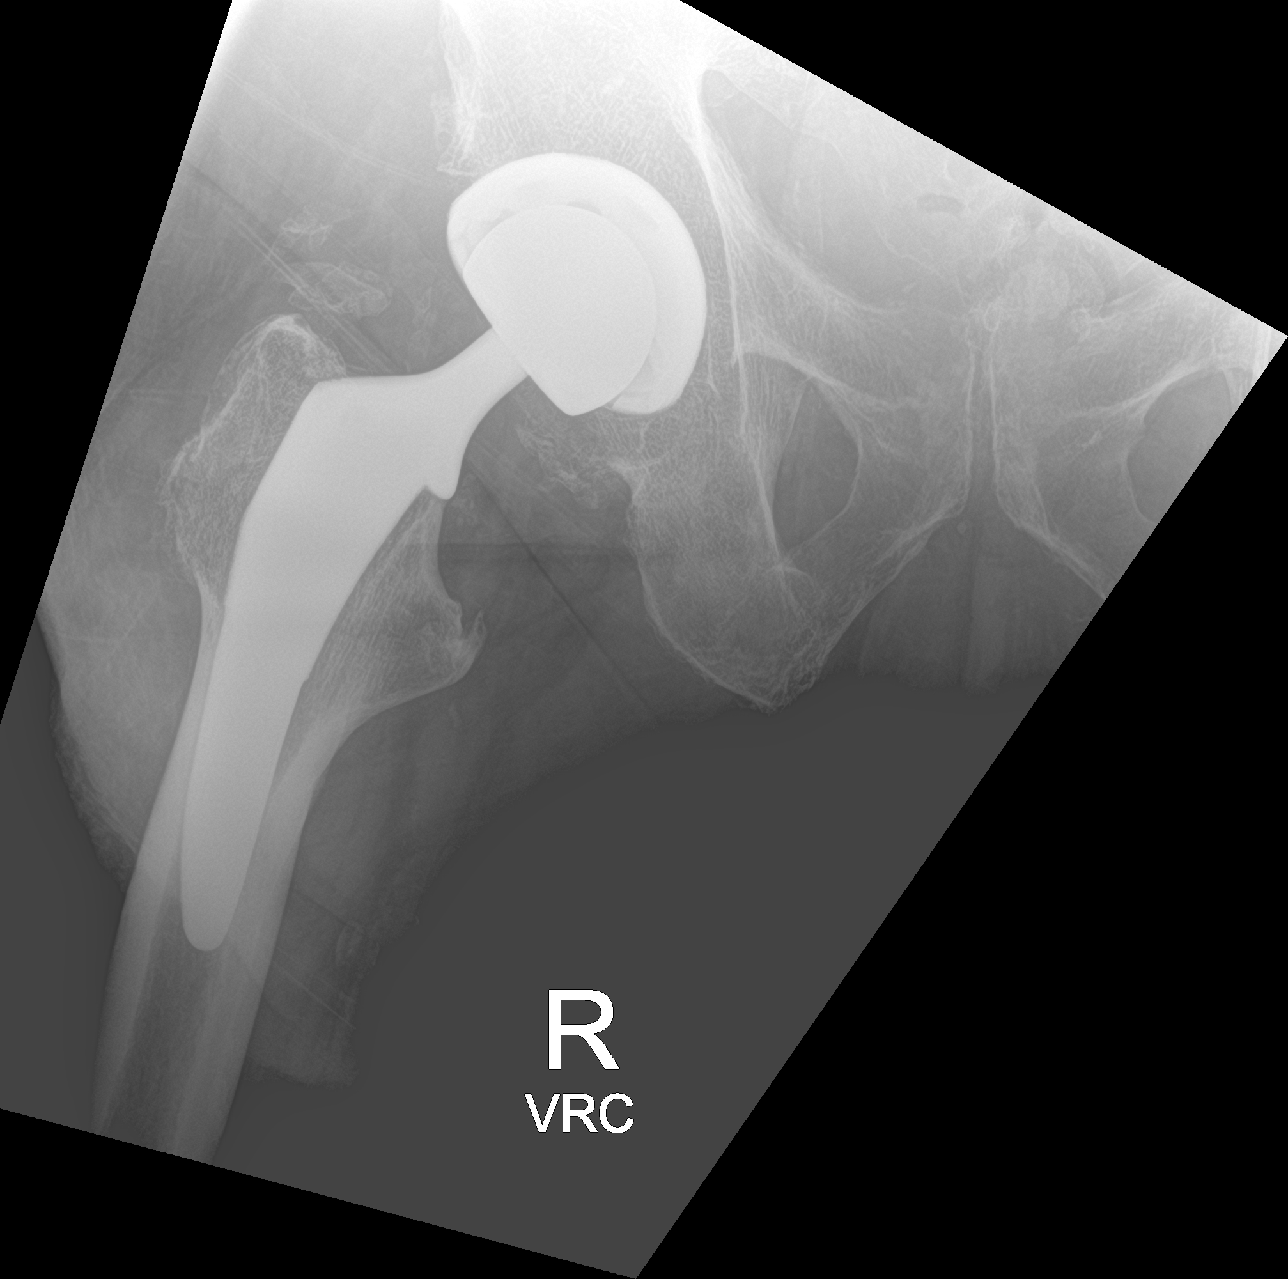

[3 of 3 positions shown; findings below may reference images not displayed]

FINDINGS: Interval RIGHT total hip arthroplasty.

Osseous mineralization diminished.

Slight irregularity at the lateral margin of the junction of the
greater trochanter and femoral metaphysis similar to prior CT exam
with overlying small rounded ossicle.

No definite acute fracture, dislocation, or bone destruction.
IMPRESSION: RIGHT hip prosthesis.

No definite acute fracture or dislocation.

## 2023-03-09 IMAGING — CT CT L SPINE W/O CM
3 of 5 series · 12 of 34 positions shown, 14 images · non-contrast
Comparison: None.

CLINICAL DATA: Back trauma, fall



[Series 6: sagittal bone · sagittal · 0.40mm/px · 4 of 85 slices shown]
[im 17/85  bone]
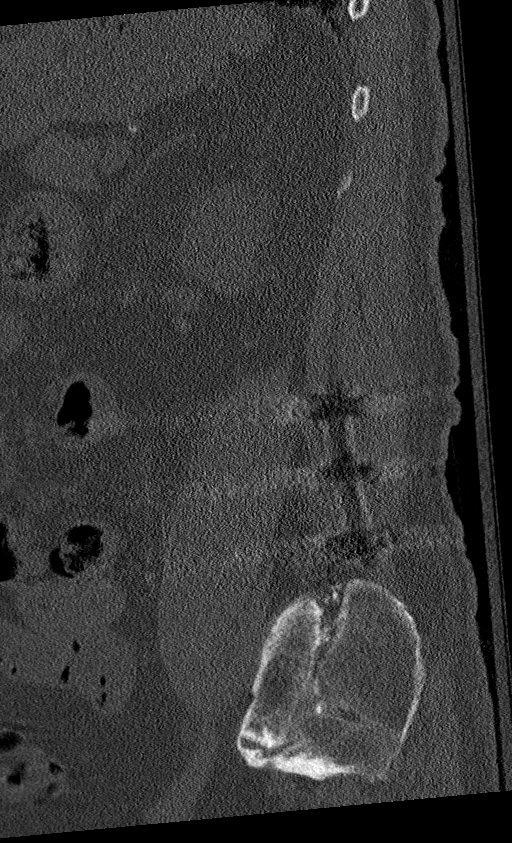
[im 34/85  bone]
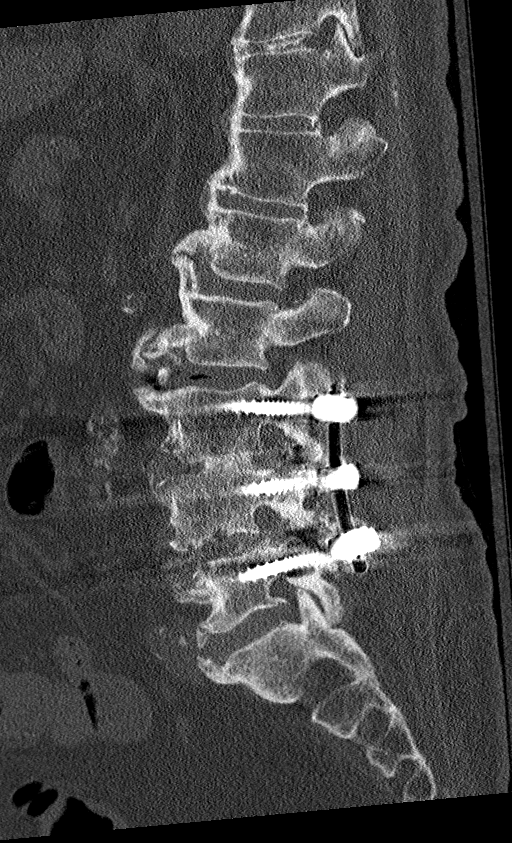
[im 51/85  bone]
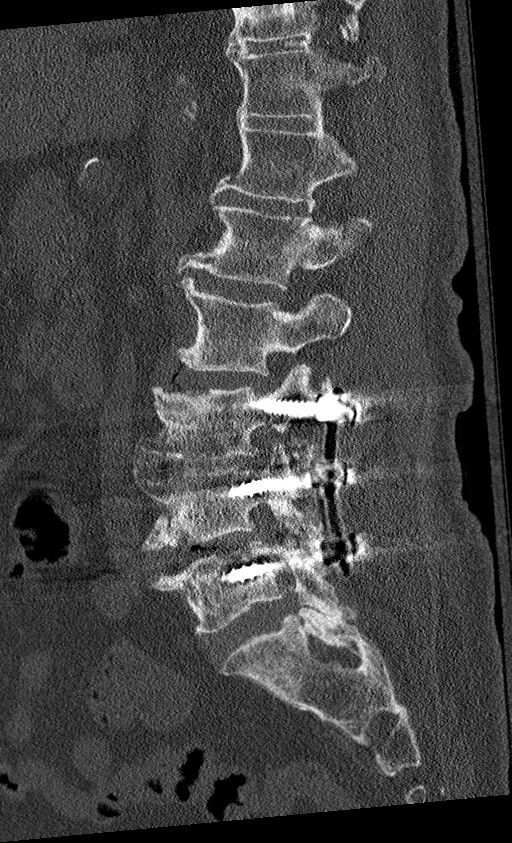
[im 68/85  bone]
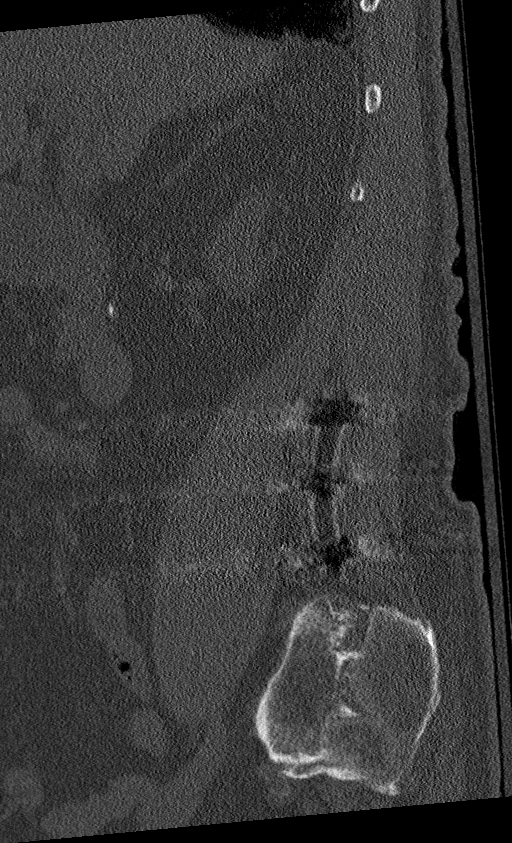

[Series 7: coronal bone · coronal · 0.42mm/px · 3 of 102 slices shown]
[im 21/102  bone]
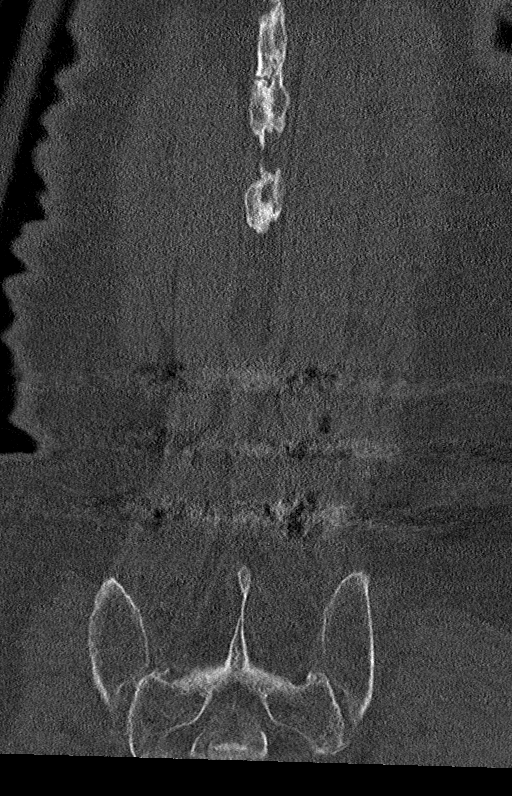
[im 41/102  bone]
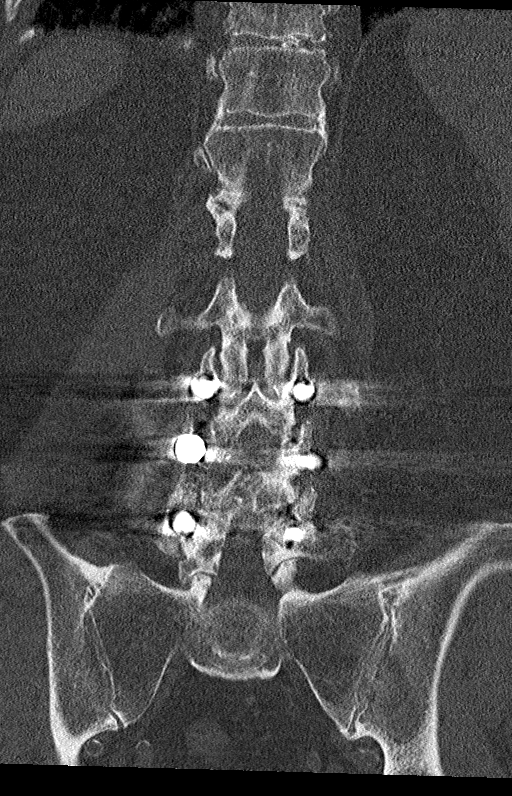
[im 61/102  bone]
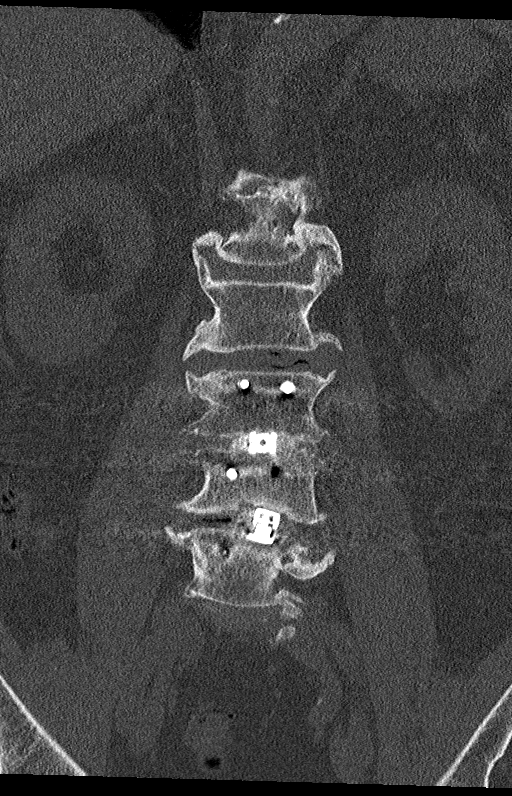

[Series 9: orthogonal axials bone · axial · 0.33mm/px · z∈[-254,-35]mm · 5 of 169 slices shown, 7 images]
[im 29/169  soft-tissue]
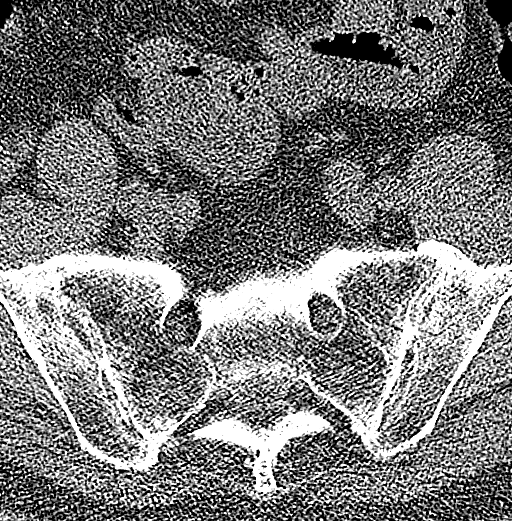
[im 29/169  bone]
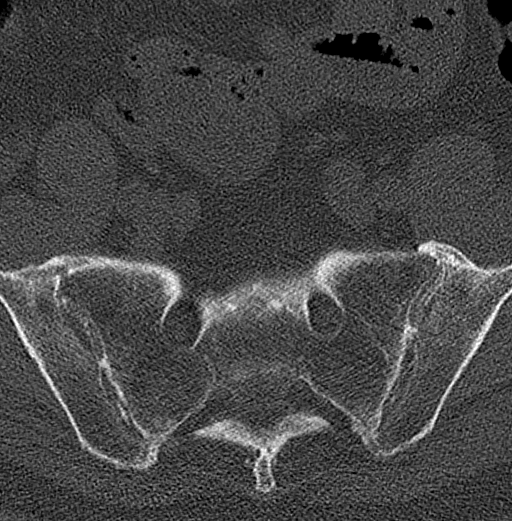
[im 57/169  bone]
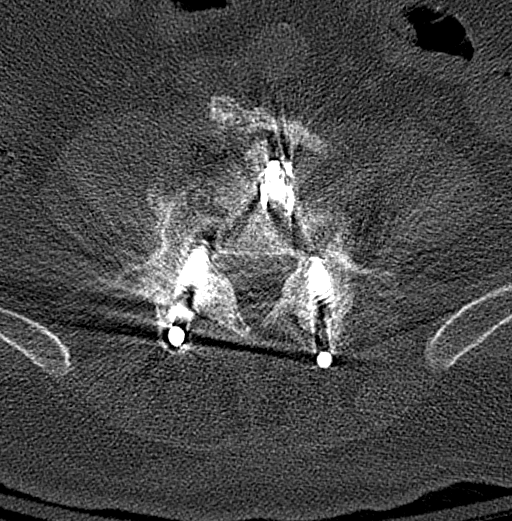
[im 85/169  bone]
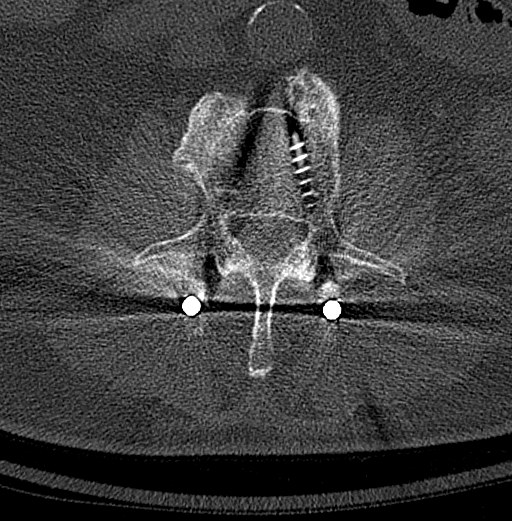
[im 113/169  bone]
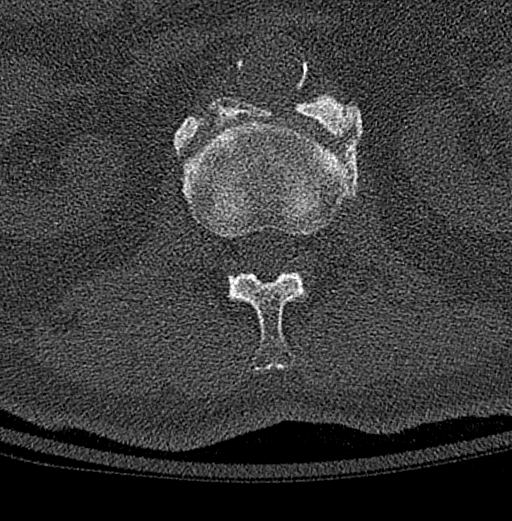
[im 141/169  soft-tissue]
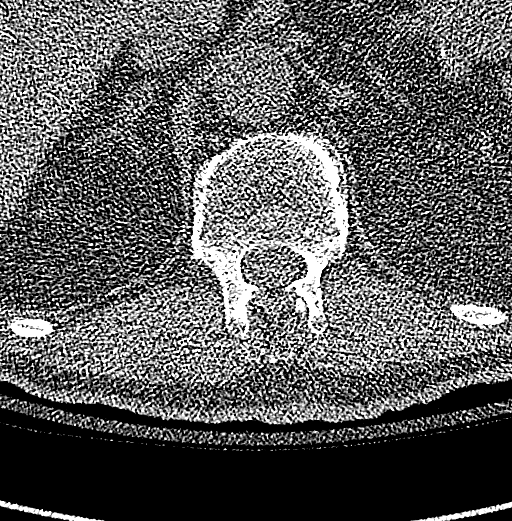
[im 141/169  bone]
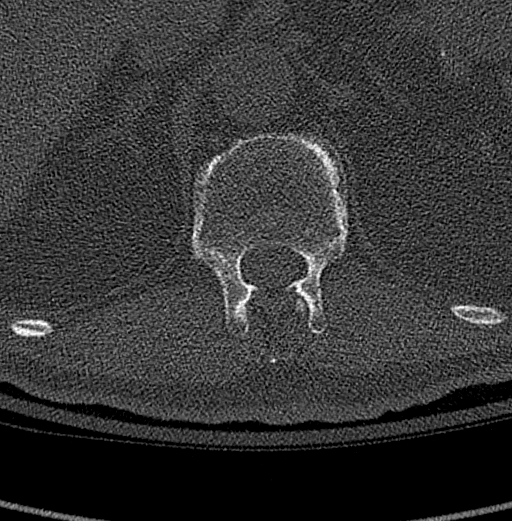

[12 of 34 positions shown; findings below may reference images not displayed]

FINDINGS: Segmentation: 5 lumbar type vertebrae.

Alignment: Normal.

Vertebrae: Posterior lumbar interbody fusion at L3-L5. Disc spacers
at L3-L4 and L4-L5. No acute fracture or focal pathologic process.

Paraspinal and other soft tissues: Negative.

Disc levels:

T12-L1: No significant finding

L1-L2: Significant findings

L2-L3: Disc osteophyte complex with narrowing of spinal canal and
lateral recess narrowing. No neural foraminal narrowing.

L3-L4: Posterior spinal fusion and laminectomy changes. No spinal
canal or neural foraminal narrowing.

L4-L5: Postsurgical changes. Facet joint arthropathy and
osteophytes. Mild right neural foraminal narrowing.

L5-S1: Laminectomy changes. Bilateral facet joint arthropathy right
greater than the left. Mild right neural foraminal narrowing.
IMPRESSION: 1. Posterior spinal fusion at L3-L5. No evidence of acute fracture
or subluxation.

2. Disc osteophyte complex with bilateral lateral recess narrowing
at L2-L3.

3. Facet joint arthropathy with mild right neural foraminal
narrowing at L5-S1.
